# Patient Record
Sex: Female | Born: 1955 | Race: White | Hispanic: No | Marital: Married | Smoking: Former smoker
Health system: Southern US, Community
[De-identification: ages and names within clinical notes are randomized; demographics above are authoritative.]

## PROBLEM LIST (undated history)

## (undated) DIAGNOSIS — I10 Essential (primary) hypertension: Secondary | ICD-10-CM

## (undated) DIAGNOSIS — N61 Mastitis without abscess: Secondary | ICD-10-CM

## (undated) DIAGNOSIS — C50419 Malignant neoplasm of upper-outer quadrant of unspecified female breast: Secondary | ICD-10-CM

## (undated) DIAGNOSIS — J45909 Unspecified asthma, uncomplicated: Secondary | ICD-10-CM

## (undated) DIAGNOSIS — I8290 Acute embolism and thrombosis of unspecified vein: Secondary | ICD-10-CM

## (undated) DIAGNOSIS — C4490 Unspecified malignant neoplasm of skin, unspecified: Secondary | ICD-10-CM

## (undated) HISTORY — DX: Mastitis without abscess: N61.0

## (undated) HISTORY — DX: Essential (primary) hypertension: I10

## (undated) HISTORY — DX: Acute embolism and thrombosis of unspecified vein: I82.90

## (undated) HISTORY — DX: Unspecified asthma, uncomplicated: J45.909

## (undated) HISTORY — DX: Malignant neoplasm of upper-outer quadrant of unspecified female breast: C50.419

## (undated) HISTORY — DX: Unspecified malignant neoplasm of skin, unspecified: C44.90

---
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
## 1976-11-15 DIAGNOSIS — I8290 Acute embolism and thrombosis of unspecified vein: Secondary | ICD-10-CM

## 1976-11-15 HISTORY — PX: BREAST SURGERY: SHX581

## 1976-11-15 HISTORY — DX: Acute embolism and thrombosis of unspecified vein: I82.90

## 2006-04-04 ENCOUNTER — Ambulatory Visit: Payer: Self-pay

## 2007-05-11 ENCOUNTER — Ambulatory Visit: Payer: Self-pay | Admitting: Unknown Physician Specialty

## 2008-11-15 DIAGNOSIS — I10 Essential (primary) hypertension: Secondary | ICD-10-CM

## 2008-11-15 DIAGNOSIS — J45909 Unspecified asthma, uncomplicated: Secondary | ICD-10-CM

## 2008-11-15 HISTORY — DX: Unspecified asthma, uncomplicated: J45.909

## 2008-11-15 HISTORY — DX: Essential (primary) hypertension: I10

## 2009-01-13 ENCOUNTER — Ambulatory Visit: Payer: Self-pay | Admitting: Family Medicine

## 2010-04-14 ENCOUNTER — Encounter: Admission: RE | Admit: 2010-04-14 | Discharge: 2010-04-14 | Payer: Self-pay | Admitting: Radiology

## 2010-12-06 ENCOUNTER — Encounter: Payer: Self-pay | Admitting: Radiology

## 2011-11-16 DIAGNOSIS — N61 Mastitis without abscess: Secondary | ICD-10-CM

## 2011-11-16 HISTORY — PX: BREAST BIOPSY: SHX20

## 2011-11-16 HISTORY — DX: Mastitis without abscess: N61.0

## 2011-11-16 HISTORY — PX: BREAST LUMPECTOMY: SHX2

## 2011-11-16 HISTORY — PX: BREAST RECONSTRUCTION: SHX9

## 2012-03-27 ENCOUNTER — Other Ambulatory Visit: Payer: Self-pay | Admitting: General Surgery

## 2012-03-27 DIAGNOSIS — T8543XA Leakage of breast prosthesis and implant, initial encounter: Secondary | ICD-10-CM

## 2012-03-27 DIAGNOSIS — N61 Mastitis without abscess: Secondary | ICD-10-CM

## 2012-03-27 DIAGNOSIS — Z803 Family history of malignant neoplasm of breast: Secondary | ICD-10-CM

## 2012-04-11 ENCOUNTER — Other Ambulatory Visit: Payer: Self-pay

## 2012-05-06 DIAGNOSIS — I10 Essential (primary) hypertension: Secondary | ICD-10-CM

## 2012-06-05 ENCOUNTER — Ambulatory Visit: Payer: Self-pay | Admitting: Anesthesiology

## 2012-06-05 LAB — CBC WITH DIFFERENTIAL/PLATELET
Basophil %: 0.6 %
Eosinophil %: 5.1 %
HGB: 13.3 g/dL (ref 12.0–16.0)
Lymphocyte #: 3.4 10*3/uL (ref 1.0–3.6)
Monocyte #: 0.7 x10 3/mm (ref 0.2–0.9)
Platelet: 203 10*3/uL (ref 150–440)
RBC: 4.59 10*6/uL (ref 3.80–5.20)

## 2012-06-08 ENCOUNTER — Ambulatory Visit: Payer: Self-pay | Admitting: General Surgery

## 2012-06-08 DIAGNOSIS — C50419 Malignant neoplasm of upper-outer quadrant of unspecified female breast: Secondary | ICD-10-CM

## 2012-06-08 HISTORY — DX: Malignant neoplasm of upper-outer quadrant of unspecified female breast: C50.419

## 2012-06-08 LAB — URINALYSIS, COMPLETE
Bacteria: NONE SEEN
Bilirubin,UR: NEGATIVE
Blood: NEGATIVE
Glucose,UR: NEGATIVE mg/dL (ref 0–75)
Leukocyte Esterase: NEGATIVE
Nitrite: NEGATIVE
Ph: 6 (ref 4.5–8.0)
RBC,UR: <1 /HPF (ref 0–5)
WBC UR: 1 /HPF (ref 0–5)

## 2012-06-08 LAB — BASIC METABOLIC PANEL
BUN: 20 mg/dL — ABNORMAL HIGH (ref 7–18)
Creatinine: 0.72 mg/dL (ref 0.60–1.30)
Glucose: 105 mg/dL — ABNORMAL HIGH (ref 65–99)
Osmolality: 284 (ref 275–301)
Potassium: 4 mmol/L (ref 3.5–5.1)
Sodium: 141 mmol/L (ref 136–145)

## 2012-07-19 ENCOUNTER — Ambulatory Visit: Payer: Self-pay | Admitting: Radiation Oncology

## 2012-08-15 ENCOUNTER — Ambulatory Visit: Payer: Self-pay | Admitting: Radiation Oncology

## 2012-08-21 LAB — CBC CANCER CENTER
Basophil %: 1.1 %
Eosinophil %: 8.8 %
Lymphocyte #: 2 x10 3/mm (ref 1.0–3.6)
MCHC: 32.6 g/dL (ref 32.0–36.0)
Monocyte #: 0.7 x10 3/mm (ref 0.2–0.9)
Platelet: 205 x10 3/mm (ref 150–440)

## 2012-08-28 LAB — CBC CANCER CENTER
Eosinophil #: 0.5 x10 3/mm (ref 0.0–0.7)
Eosinophil %: 10.1 %
Lymphocyte #: 1.6 x10 3/mm (ref 1.0–3.6)
MCH: 29.2 pg (ref 26.0–34.0)
MCV: 89 fL (ref 80–100)
Monocyte #: 0.7 x10 3/mm (ref 0.2–0.9)
Platelet: 178 x10 3/mm (ref 150–440)
RBC: 4.46 10*6/uL (ref 3.80–5.20)

## 2012-09-04 LAB — CBC CANCER CENTER
Basophil #: 0 x10 3/mm (ref 0.0–0.1)
Basophil %: 1.1 %
Eosinophil #: 0.4 x10 3/mm (ref 0.0–0.7)
Lymphocyte %: 20 %
MCHC: 32.4 g/dL (ref 32.0–36.0)
Monocyte #: 0.6 x10 3/mm (ref 0.2–0.9)
Platelet: 176 x10 3/mm (ref 150–440)
RDW: 12.7 % (ref 11.5–14.5)
WBC: 4.5 x10 3/mm (ref 3.6–11.0)

## 2012-09-11 LAB — CBC CANCER CENTER
Basophil #: 0.1 x10 3/mm (ref 0.0–0.1)
Lymphocyte #: 0.8 x10 3/mm — ABNORMAL LOW (ref 1.0–3.6)
MCH: 28.9 pg (ref 26.0–34.0)
MCV: 89 fL (ref 80–100)
Monocyte #: 0.6 x10 3/mm (ref 0.2–0.9)
Platelet: 164 x10 3/mm (ref 150–440)
RDW: 12.9 % (ref 11.5–14.5)
WBC: 4.4 x10 3/mm (ref 3.6–11.0)

## 2012-09-13 IMAGING — US US OUTSIDE FILMS BREAST
1 series · 14 of 21 positions shown · non-contrast
Comparison: none

[Series 1: us outside films breast · 14 of 21 slices shown]
[im 1/21]
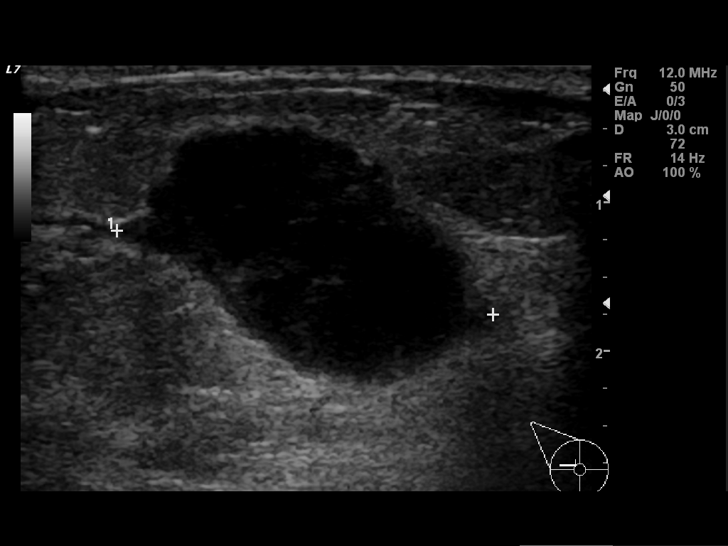
[im 3/21]
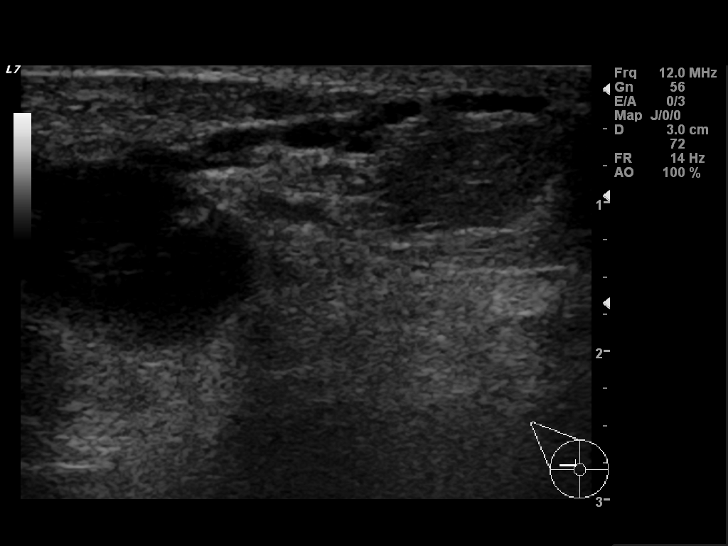
[im 4/21]
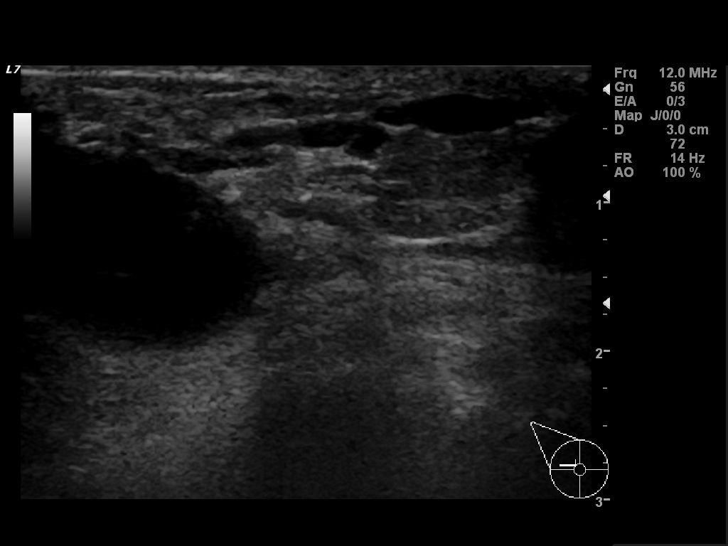
[im 6/21]
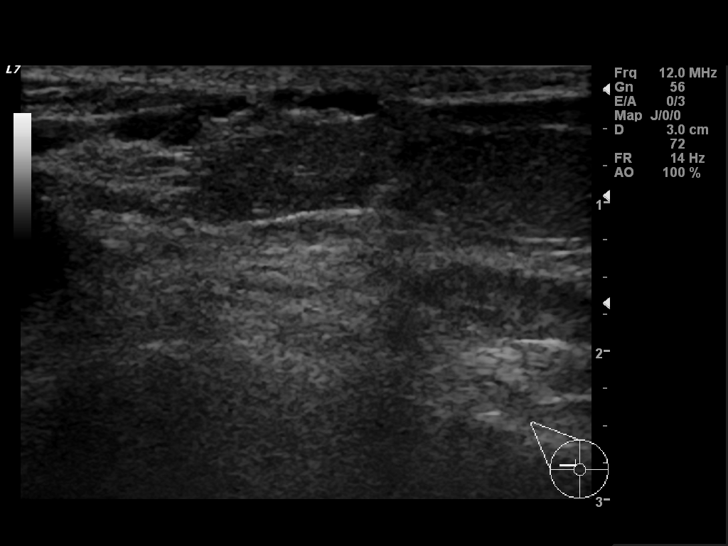
[im 7/21]
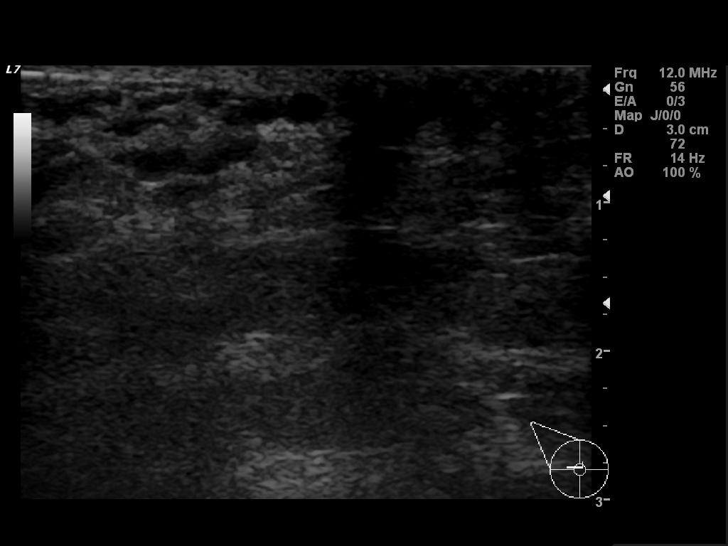
[im 9/21]
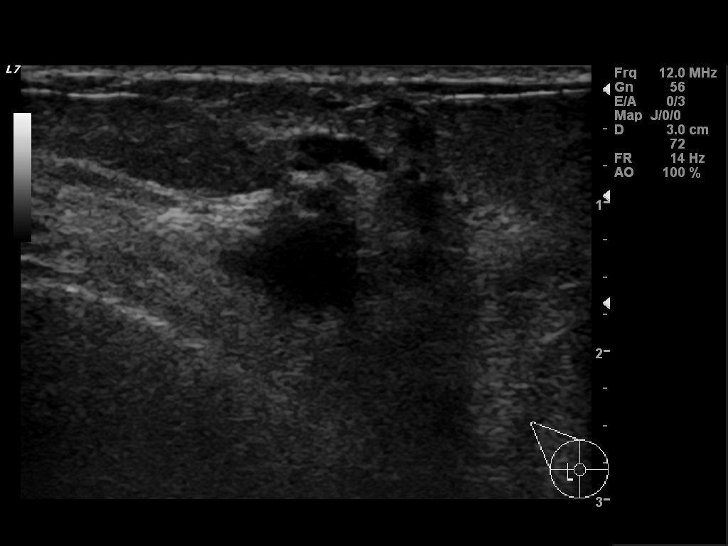
[im 10/21]
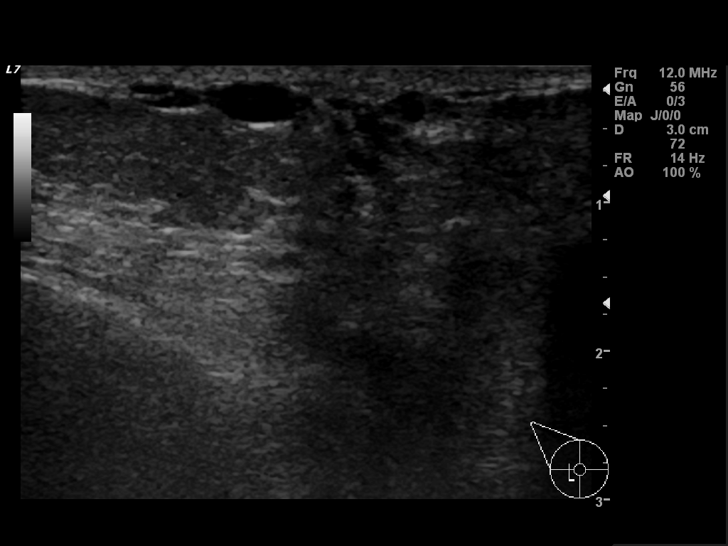
[im 12/21]
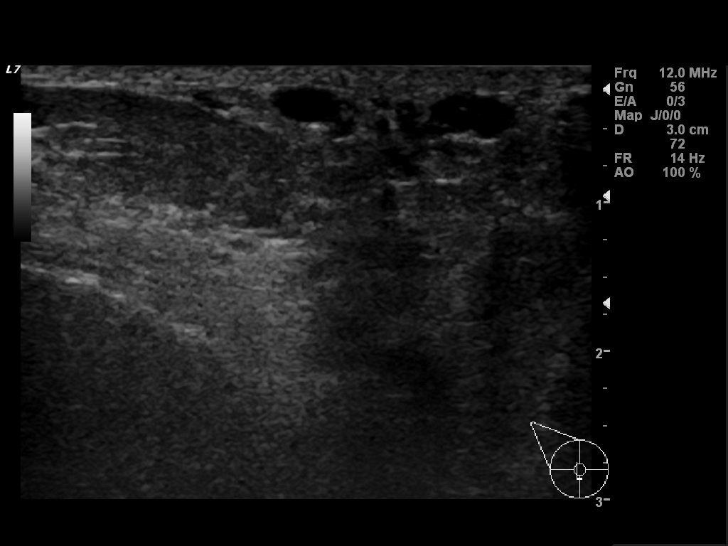
[im 13/21]
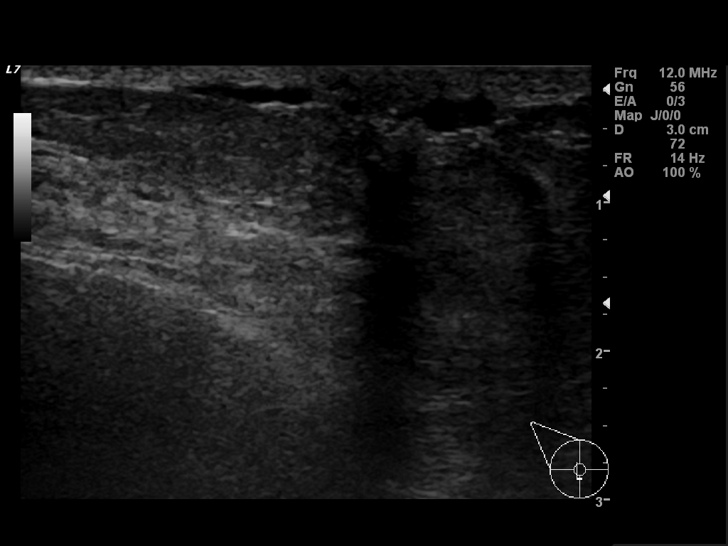
[im 15/21]
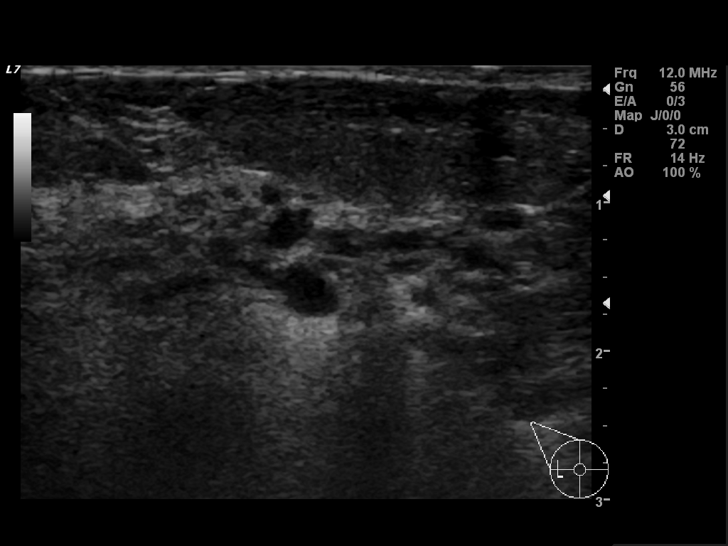
[im 16/21]
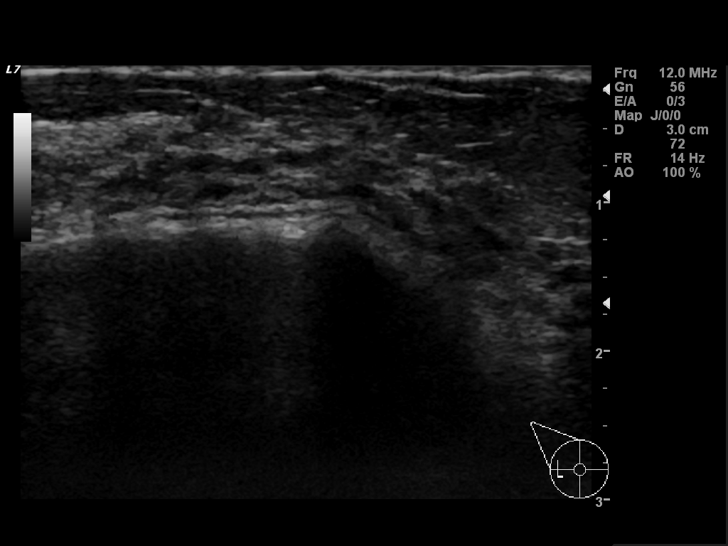
[im 18/21]
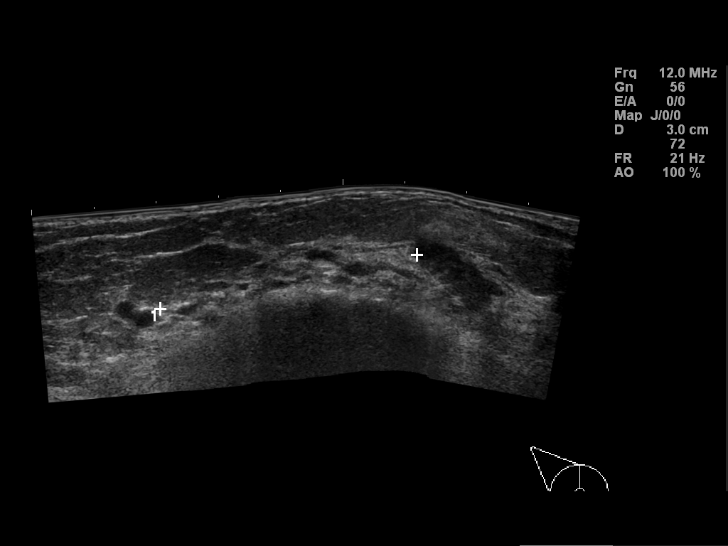
[im 19/21]
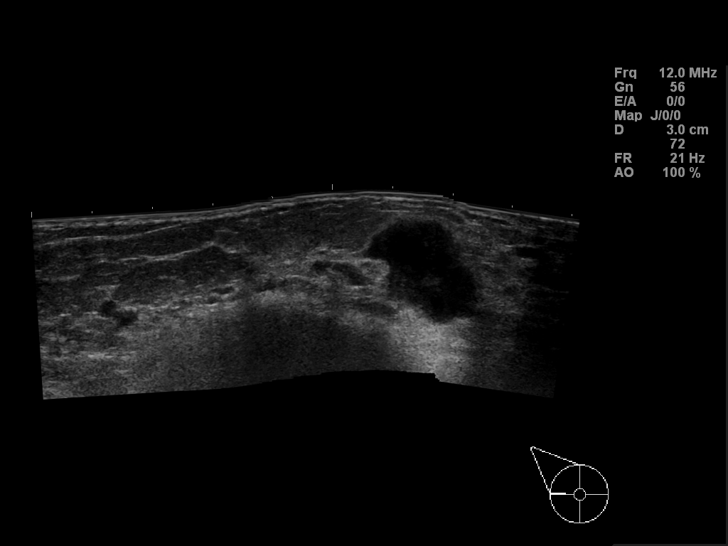
[im 21/21]
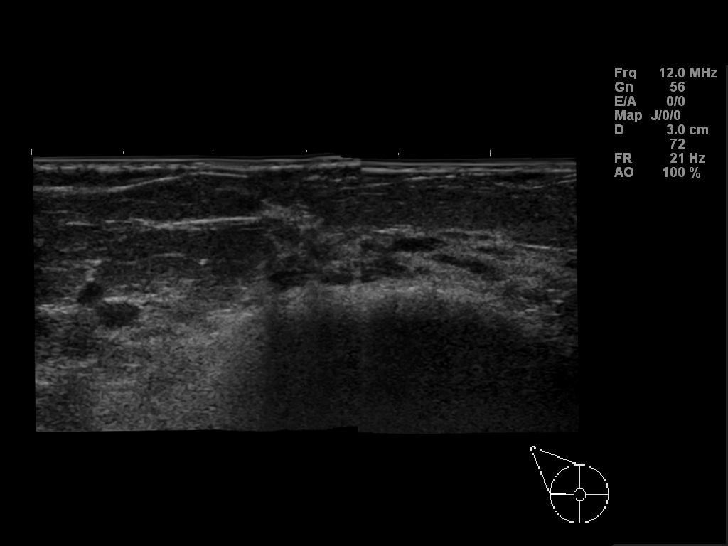

[14 of 21 positions shown; findings below may reference images not displayed]

IMAGES IMPORTED FROM THE [employer] WORKFLOW SYSTEM
NO DICTATION FOR STUDY

## 2012-09-15 ENCOUNTER — Ambulatory Visit: Payer: Self-pay | Admitting: Radiation Oncology

## 2012-09-19 LAB — CBC CANCER CENTER
Basophil #: 0.1 x10 3/mm (ref 0.0–0.1)
Basophil %: 1.3 %
Eosinophil %: 10.3 %
HCT: 41.5 % (ref 35.0–47.0)
HGB: 13.5 g/dL (ref 12.0–16.0)
Lymphocyte #: 0.8 x10 3/mm — ABNORMAL LOW (ref 1.0–3.6)
Lymphocyte %: 18.7 %
MCV: 90 fL (ref 80–100)
Monocyte %: 15.5 %
Neutrophil #: 2.4 x10 3/mm (ref 1.4–6.5)
WBC: 4.5 x10 3/mm (ref 3.6–11.0)

## 2012-09-25 LAB — CBC CANCER CENTER
Basophil #: 0 x10 3/mm (ref 0.0–0.1)
Basophil %: 0.9 %
Eosinophil #: 0.4 x10 3/mm (ref 0.0–0.7)
Eosinophil %: 8.5 %
HGB: 13.8 g/dL (ref 12.0–16.0)
Lymphocyte #: 0.7 x10 3/mm — ABNORMAL LOW (ref 1.0–3.6)
Lymphocyte %: 14.7 %
Monocyte #: 0.7 x10 3/mm (ref 0.2–0.9)
Monocyte %: 15.1 %
Neutrophil %: 60.8 %
RBC: 4.77 10*6/uL (ref 3.80–5.20)
WBC: 4.9 x10 3/mm (ref 3.6–11.0)

## 2012-10-15 ENCOUNTER — Ambulatory Visit: Payer: Self-pay | Admitting: Radiation Oncology

## 2012-11-15 ENCOUNTER — Ambulatory Visit: Payer: Self-pay | Admitting: Radiation Oncology

## 2012-11-15 HISTORY — PX: BREAST RECONSTRUCTION: SHX9

## 2012-11-29 ENCOUNTER — Ambulatory Visit: Payer: Self-pay | Admitting: General Surgery

## 2012-12-11 ENCOUNTER — Ambulatory Visit: Payer: Self-pay | Admitting: General Surgery

## 2012-12-19 ENCOUNTER — Ambulatory Visit: Payer: Self-pay

## 2012-12-23 ENCOUNTER — Encounter: Payer: Self-pay | Admitting: General Surgery

## 2012-12-23 DIAGNOSIS — D0590 Unspecified type of carcinoma in situ of unspecified breast: Secondary | ICD-10-CM

## 2012-12-23 DIAGNOSIS — D0511 Intraductal carcinoma in situ of right breast: Secondary | ICD-10-CM | POA: Insufficient documentation

## 2012-12-23 DIAGNOSIS — J45909 Unspecified asthma, uncomplicated: Secondary | ICD-10-CM

## 2013-03-30 ENCOUNTER — Ambulatory Visit: Payer: Self-pay | Admitting: Unknown Physician Specialty

## 2013-04-15 ENCOUNTER — Ambulatory Visit: Payer: Self-pay | Admitting: Radiation Oncology

## 2013-05-29 ENCOUNTER — Ambulatory Visit: Payer: Self-pay | Admitting: General Surgery

## 2013-05-30 ENCOUNTER — Encounter: Payer: Self-pay | Admitting: General Surgery

## 2013-05-30 ENCOUNTER — Ambulatory Visit: Payer: Self-pay | Admitting: General Surgery

## 2013-06-20 ENCOUNTER — Encounter: Payer: Self-pay | Admitting: General Surgery

## 2013-06-20 ENCOUNTER — Other Ambulatory Visit: Payer: Self-pay

## 2013-06-20 ENCOUNTER — Ambulatory Visit (INDEPENDENT_AMBULATORY_CARE_PROVIDER_SITE_OTHER): Payer: BC Managed Care – PPO | Admitting: General Surgery

## 2013-06-20 VITALS — BP 140/78 | HR 74 | Resp 12 | Ht 66.0 in | Wt 171.0 lb

## 2013-06-20 DIAGNOSIS — Z853 Personal history of malignant neoplasm of breast: Secondary | ICD-10-CM

## 2013-06-20 NOTE — Progress Notes (Signed)
Patient ID: Terri Morrison, female   DOB: 06-Dec-1955, 57 y.o.   MRN: [id number]  Chief Complaint  Patient presents with  . Other    mammogram    HPI Terri Morrison is a 57 y.o. female who presents for a breast evaluation. The most recent mammogram was done on 04/29/13 cat 2. Patient does perform regular self breast checks and gets regular mammograms done.  The patient was treated for a right breast DCIS with wide excision followed by whole breast radiation and subsequent mammoplasty in summer 2013. A sentinel node biopsy was completed at that time as well. The patient has no complaints at this time.  Weight is up 4 pounds from her January 2014 exam.  HPI  Past Medical History  Diagnosis Date  . Asthma 2010  . Hypertension 2010  . Cancer 2013    Right breast DCIS  . Blood clot in vein 1978  . Acute mastitis of right breast 2013    Past Surgical History  Procedure Laterality Date  . Tonsillectomy  1973  . Breast surgery Bilateral 1978    Breast augmentation  . Breast biopsy Right 2013    DCIS  . Breast lumpectomy Right 2013  . Breast reconstruction Right 2013  . Breast reconstruction Left 2014    Family History  Problem Relation Age of Onset  . Cancer Sister 34    Breast    Social History History  Substance Use Topics  . Smoking status: Former Smoker -- 1.00 packs/day for 16 years    Types: Cigarettes  . Smokeless tobacco: Never Used  . Alcohol Use: 0.5 oz/week    1 drink(s) per week     Comment: Occasional drinker    Allergies  Allergen Reactions  . Cefdinir Hives    Current Outpatient Prescriptions  Medication Sig Dispense Refill  . diphenhydrAMINE (BENADRYL) 25 MG tablet Take 25 mg by mouth every 6 (six) hours as needed for itching.      . fexofenadine (ALLEGRA) 180 MG tablet Take 180 mg by mouth 2 (two) times daily.      . hydroxychloroquine (PLAQUENIL) 200 MG tablet Take 200 mg by mouth 2 (two) times daily.      Marland Kitchen losartan (COZAAR) 100 MG tablet  Take 100 mg by mouth daily.      . montelukast (SINGULAIR) 10 MG tablet Take 10 mg by mouth at bedtime.      . ranitidine (ZANTAC) 75 MG tablet Take 75 mg by mouth daily.      . simvastatin (ZOCOR) 20 MG tablet Take 20 mg by mouth every evening.      . naproxen sodium (ANAPROX) 220 MG tablet Take 220 mg by mouth 2 (two) times daily with a meal.       No current facility-administered medications for this visit.    Review of Systems Review of Systems  Constitutional: Negative.   Respiratory: Negative.   Cardiovascular: Negative.     Blood pressure 140/78, pulse 74, resp. rate 12, height 5\' 6"  (1.676 m), weight 171 lb (77.565 kg), last menstrual period 12/23/2002.  Physical Exam Physical Exam  Constitutional: She is oriented to person, place, and time. She appears well-developed and well-nourished.  Neck: Neck supple.  Cardiovascular: Normal rate, regular rhythm and normal heart sounds.   Pulmonary/Chest: Breath sounds normal. Right breast exhibits no inverted nipple, no mass, no nipple discharge, no skin change and no tenderness. Left breast exhibits no inverted nipple, no mass, no nipple discharge, no skin change  and no tenderness.  Left breast well healed incision up around the nipple and down to 6 o'clock.Tennder at the axillary tail of the breast. Right breast scar nicely healed.  Lymphadenopathy:    She has no cervical adenopathy.  Neurological: She is alert and oriented to person, place, and time.  Skin: Skin is warm and dry.  Mild thickening maxillary tail is noted bilaterally.  Data Reviewed May 29, 2013 bilateral mammograms were reviewed. No interval change. BI-RAD-2.  Ultrasound examination of the axillary tail bilaterally was completed due to focal thickening. This showed normal architecture. No cystic or solid lesions.  Assessment    Benign breast exam.  One year status post treatment right breast DCIS. ER less than 5%, PR negative.     Plan    Antiestrogen  therapy was not felt to be warranted based on the very low ER status.  Arrangements were made for diagnostic bilateral mammograms in one year. Considering the most recent mammograms, which were reviewed, a 6 month followup of the treated breast is felt to be of little benefit.        Terri Morrison 06/21/2013, 9:34 PM

## 2013-06-20 NOTE — Patient Instructions (Addendum)
Patient to return one year mammogram.

## 2013-06-21 ENCOUNTER — Encounter: Payer: Self-pay | Admitting: General Surgery

## 2013-11-15 HISTORY — PX: COLONOSCOPY: SHX174

## 2014-06-06 ENCOUNTER — Encounter: Payer: Self-pay | Admitting: General Surgery

## 2014-06-26 ENCOUNTER — Encounter: Payer: Self-pay | Admitting: General Surgery

## 2014-07-08 ENCOUNTER — Ambulatory Visit: Payer: BC Managed Care – PPO | Admitting: General Surgery

## 2014-07-08 ENCOUNTER — Encounter: Payer: Self-pay | Admitting: General Surgery

## 2014-07-08 ENCOUNTER — Ambulatory Visit (INDEPENDENT_AMBULATORY_CARE_PROVIDER_SITE_OTHER): Payer: BC Managed Care – PPO | Admitting: General Surgery

## 2014-07-08 VITALS — BP 134/76 | HR 74 | Resp 12 | Ht 66.0 in | Wt 158.0 lb

## 2014-07-08 DIAGNOSIS — Z853 Personal history of malignant neoplasm of breast: Secondary | ICD-10-CM

## 2014-07-08 MED ORDER — TAMOXIFEN CITRATE 20 MG PO TABS: 20.0000 mg | ORAL_TABLET | Freq: Every day | ORAL | Status: DC

## 2014-07-08 NOTE — Patient Instructions (Addendum)
.  The patient has been asked to return to the office in one year with a bilateral diagnostic mammogram. Discussed Tamoxifen. Patient to try Tamoxifen for one month and call the office and let us know how you are doing.

## 2014-07-08 NOTE — Progress Notes (Signed)
Patient ID: Terri Morrison, female   DOB: 05/31/1956, 58 y.o.   MRN: [id number]  Chief Complaint  Patient presents with  . Follow-up    mammogram    HPI Terri Morrison is a 58 y.o. female who presents for a breast evaluation. The most recent mammogram was done on 06/25/14 at [facility]  Patient does perform regular self breast checks and gets regular mammograms done.    HPI  Past Medical History  Diagnosis Date  . Asthma 2010  . Hypertension 2010  . Blood clot in vein 1978  . Acute mastitis of right breast 2013  . 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    Past Surgical History  Procedure Laterality Date  . Tonsillectomy  1973  . Breast surgery Bilateral 1978    Breast augmentation  . Breast biopsy Right 2013    DCIS, ER less than 5%, PR negative.  Multiple positive margins identified after mastopexy making reexcision unreliable. Decision to complete whole breast radiation rather than proceed to mastectomy. Increased risk for recurrent disease reviewed with patient and family..  . Breast lumpectomy Right 2013  . Breast reconstruction Right 2013  . Breast reconstruction Left 2014    Family History  Problem Relation Age of Onset  . Cancer Sister 85    Breast    Social History History  Substance Use Topics  . Smoking status: Former Smoker -- 1.00 packs/day for 16 years    Types: Cigarettes  . Smokeless tobacco: Never Used  . Alcohol Use: 0.5 oz/week    1 drink(s) per week     Comment: Occasional drinker    Allergies  Allergen Reactions  . Cefdinir Hives    Current Outpatient Prescriptions  Medication Sig Dispense Refill  . diphenhydrAMINE (BENADRYL) 25 MG tablet Take 25 mg by mouth every 6 (six) hours as needed for itching.      . fexofenadine (ALLEGRA) 180 MG tablet Take 180 mg by mouth 2 (two) times daily.      . hydroxychloroquine (PLAQUENIL) 200 MG tablet Take 200 mg by mouth 2  (two) times daily.      . hydrOXYzine (ATARAX/VISTARIL) 25 MG tablet Take 25 mg by mouth. 25 mg for 2d, 50 mg for 2d, 75 mg for 2 d, then 100 mg every night.      Marland Kitchen ibuprofen (ADVIL,MOTRIN) 600 MG tablet Take 600 mg by mouth every 8 (eight) hours as needed.       Marland Kitchen losartan (COZAAR) 100 MG tablet Take 100 mg by mouth daily.      . montelukast (SINGULAIR) 10 MG tablet Take 10 mg by mouth at bedtime.      . naproxen sodium (ANAPROX) 220 MG tablet Take 220 mg by mouth 2 (two) times daily with a meal.      . ranitidine (ZANTAC) 75 MG tablet Take 75 mg by mouth daily.      . simvastatin (ZOCOR) 20 MG tablet Take 20 mg by mouth every evening.      . tamoxifen (NOLVADEX) 20 MG tablet Take 1 tablet (20 mg total) by mouth daily.  30 tablet  12   No current facility-administered medications for this visit.    Review of Systems Review of Systems  Constitutional: Negative.   Respiratory: Negative.   Cardiovascular: Negative.     Blood pressure 134/76, pulse 74, resp. rate 12, height 5\' 6"  (1.676 m), weight 158 lb (  71.668 kg), last menstrual period 12/23/2002.  Physical Exam Physical Exam  Constitutional: She is oriented to person, place, and time. She appears well-developed and well-nourished.  Eyes: Conjunctivae are normal. No scleral icterus.  Neck: Neck supple.  Cardiovascular: Normal rate, regular rhythm and normal heart sounds.   Pulmonary/Chest: Effort normal and breath sounds normal. Right breast exhibits no inverted nipple, no mass, no nipple discharge, no skin change and no tenderness. Left breast exhibits no inverted nipple, no mass, no nipple discharge, no skin change and no tenderness.  Right breast thickening along  Mastoplasty scar.   Good breast volume & symmetry.  Abdominal: Soft. Bowel sounds are normal. There is no tenderness.  Lymphadenopathy:    She has no cervical adenopathy.    She has no axillary adenopathy.  Neurological: She is alert and oriented to person, place, and  time.  Skin: Skin is warm and dry.    Data Reviewed Bilateral mammograms dated June 25, 2014 showed postlumpectomy changes. BI-RAD-2.  Assessment    Benign breast exam.     Plan    Recent literature supports the use of anti-estrogen therapy even tumors with 05% ER positivity. The pros and cons of fashion therapy with tamoxifen for management of DCIS were reviewed. Risks include DVT, uterine cancer and vasomotor symptoms. The patient does have a history of DVT occurring after 1978 breast implant procedure. No other risk factors (no longer smoking).  At this time the patient's amenable to a trial of tamoxifen 20 mg daily. She'll report her tolerance in one month. If she is doing well she'll continue the medication. If she Tolerated poorly, will discuss changing to Evista.     PCP: Virgie Dad 07/10/2014, 8:28 PM

## 2014-07-09 ENCOUNTER — Encounter: Payer: Self-pay | Admitting: General Surgery

## 2014-07-09 DIAGNOSIS — Z853 Personal history of malignant neoplasm of breast: Secondary | ICD-10-CM | POA: Insufficient documentation

## 2014-07-10 ENCOUNTER — Encounter: Payer: Self-pay | Admitting: General Surgery

## 2014-09-16 ENCOUNTER — Encounter: Payer: Self-pay | Admitting: General Surgery

## 2014-11-15 DIAGNOSIS — C4490 Unspecified malignant neoplasm of skin, unspecified: Secondary | ICD-10-CM

## 2014-11-15 HISTORY — DX: Unspecified malignant neoplasm of skin, unspecified: C44.90

## 2014-12-12 ENCOUNTER — Telehealth: Payer: Self-pay

## 2014-12-12 NOTE — Telephone Encounter (Signed)
Patient calling with concerns about discomfort at her incision site of her right breast and nipple discomfort. She had surgery in July of 2013 and wanted to speak with someone about this.

## 2014-12-12 NOTE — Telephone Encounter (Signed)
I talked with the patient and she feels the area is "ok" but she just wanted to have it checked. Denies redness or swelling , states it is near the incision area and nipple right breast. Appointment made 12-26-14 (best time for pt). She will monitor the area use heat and ibuprofen and call for new changes.

## 2014-12-26 ENCOUNTER — Ambulatory Visit (INDEPENDENT_AMBULATORY_CARE_PROVIDER_SITE_OTHER): Payer: Medicare Other | Admitting: General Surgery

## 2014-12-26 ENCOUNTER — Encounter: Payer: Self-pay | Admitting: General Surgery

## 2014-12-26 VITALS — BP 148/78 | HR 76 | Resp 14 | Ht 66.0 in | Wt 174.0 lb

## 2014-12-26 DIAGNOSIS — N644 Mastodynia: Secondary | ICD-10-CM

## 2014-12-26 NOTE — Progress Notes (Signed)
Patient ID: Terri Morrison, female   DOB: [birth year], 59 y.o.   MRN: [id number]  Chief Complaint  Patient presents with  . Breast Problem    HPI Terri Morrison is a 59 y.o. female.  Here today for evaluation of soreness near the incision area and nipple right breast. She first noticed it about 3 weeks. The ibuprofen seems to be helping. She has not been taking her tamoxifen because of side effects but she cant remember when she stopped taking it or the side effect. Asthma has been an issue this week with an occasional productive cough.  HPI  Past Medical History  Diagnosis Date  . Asthma 2010  . Hypertension 2010  . Blood clot in vein 1978  . Acute mastitis of right breast 2013  . 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    Past Surgical History  Procedure Laterality Date  . Tonsillectomy  1973  . Breast surgery Bilateral 1978    Breast augmentation  . Breast biopsy Right 2013    DCIS, ER less than 5%, PR negative.  Multiple positive margins identified after mastopexy making reexcision unreliable. Decision to complete whole breast radiation rather than proceed to mastectomy. Increased risk for recurrent disease reviewed with patient and family..  . Breast lumpectomy Right 2013  . Breast reconstruction Right 2013  . Breast reconstruction Left 2014    Family History  Problem Relation Age of Onset  . Cancer Sister 77    Breast    Social History History  Substance Use Topics  . Smoking status: Former Smoker -- 1.00 packs/day for 16 years    Types: Cigarettes  . Smokeless tobacco: Never Used  . Alcohol Use: 0.5 oz/week    1 drink(s) per week     Comment: Occasional drinker    Allergies  Allergen Reactions  . Cefdinir Hives  . Clindamycin/Lincomycin Hives    Current Outpatient Prescriptions  Medication Sig Dispense Refill  . albuterol (PROVENTIL HFA;VENTOLIN HFA) 108 (90 BASE)  MCG/ACT inhaler Inhale into the lungs every 6 (six) hours as needed for wheezing or shortness of breath.    . diphenhydrAMINE (BENADRYL) 25 MG tablet Take 25 mg by mouth every 6 (six) hours as needed for itching.    . fexofenadine (ALLEGRA) 180 MG tablet Take 180 mg by mouth 2 (two) times daily.    . Fluticasone-Salmeterol (ADVAIR) 250-50 MCG/DOSE AEPB Inhale 1 puff into the lungs 2 (two) times daily.    . hydroxychloroquine (PLAQUENIL) 200 MG tablet Take 200 mg by mouth 2 (two) times daily.    Marland Kitchen ibuprofen (ADVIL,MOTRIN) 600 MG tablet Take 600 mg by mouth every 8 (eight) hours as needed.     Marland Kitchen losartan (COZAAR) 100 MG tablet Take 100 mg by mouth daily.    . Melatonin 10 MG CAPS Take by mouth as needed.    . montelukast (SINGULAIR) 10 MG tablet Take 10 mg by mouth at bedtime.    . ranitidine (ZANTAC) 75 MG tablet Take 75 mg by mouth daily.    . simvastatin (ZOCOR) 20 MG tablet Take 20 mg by mouth every evening.     No current facility-administered medications for this visit.    Review of Systems Review of Systems  Constitutional: Negative.   Respiratory: Positive for cough.   Cardiovascular: Negative.     Blood pressure 148/78, pulse 76, resp. rate 14, height 5\' 6"  (1.676 m), weight  174 lb (78.926 kg), last menstrual period 12/23/2002.  Physical Exam Physical Exam  Constitutional: She is oriented to person, place, and time. She appears well-developed and well-nourished.  Neck: Neck supple.  Cardiovascular: Normal rate, regular rhythm and normal heart sounds.   Pulmonary/Chest: Effort normal. She has wheezes (Inspiratory wheezes in the lower lobes bilaterally.). She has rhonchi in the left upper field. Right breast exhibits skin change and tenderness. Right breast exhibits no inverted nipple, no mass and no nipple discharge. Left breast exhibits no inverted nipple, no mass, no nipple discharge, no skin change and no tenderness.    Abnormal breath sounds.  Thickening along right breast  scar at 7 o'clock with telangectasia and tenderness along the scar.   Lymphadenopathy:    She has no cervical adenopathy.    She has no axillary adenopathy.  Neurological: She is alert and oriented to person, place, and time.  Skin: Skin is warm and dry.      Assessment    Mastalgia likely secondary to ongoing effects of previous radiation therapy.    Plan    In spite of her benign clinical exam we'll arrange for a right breast mammogram. We'll reassess her progress in one month.    The patient has been asked to have a right diagnostic mammogram. This has been arranged at [facility] for 01-10-15 at 9:30 am. Patient is aware of date, time, and instructions.   Continue heating pad and ibuprofen as needed for comfort.  Observation for one month provided mammogram is normal or possibly proceed with biopsy.   PCP:  Virgie Dad 12/27/2014, 8:42 PM

## 2014-12-26 NOTE — Patient Instructions (Addendum)
The patient has been asked to have a right diagnostic mammogram.

## 2014-12-27 DIAGNOSIS — N644 Mastodynia: Secondary | ICD-10-CM | POA: Insufficient documentation

## 2015-01-06 ENCOUNTER — Encounter: Payer: Self-pay | Admitting: General Surgery

## 2015-01-23 ENCOUNTER — Ambulatory Visit: Payer: Medicare Other | Admitting: General Surgery

## 2015-03-04 NOTE — Op Note (Signed)
PATIENT NAME:  Terri Morrison, Terri Morrison MR#:  [id number] DATE OF BIRTH:  Apr 25, 1956  DATE OF PROCEDURE:  06/08/2012  PREOPERATIVE DIAGNOSIS: DCIS right breast.   POSTOPERATIVE DIAGNOSIS: DCIS right breast.   OPERATIVE PROCEDURE: Wide local excision, sentinel node biopsy.   OPERATING SURGEON: Robert Bellow, MD   ASSISTANT: Nicholaus Bloom, MD    ANESTHESIA: General.  ESTIMATED BLOOD LOSS: Less than 25 mL.   CLINICAL NOTE: This 59 year old woman was identified with an area of DCIS involving the right breast as well as a ruptured prepectoral implant. She was felt to be a candidate for wide excision and implant removal. The possibility of mastectomy or immediate reconstruction and a new implant had been reviewed with the patient. Coordination had been completed between Nicholaus Bloom, MD from the plastic surgery service and myself.   PROCEDURE: The patient has a history of penicillin allergy and for that reason received vancomycin prior to the procedure. The skin markings were completed by Dr. Tula Nakayama prior to the induction of anesthesia. The lesion was at the 8 to 9 o'clock position of the right breast. Ultrasound was used to identify the most medial aspect of the previous biopsy site. Using ultrasound guidance to remove the old biopsy site as review of her previous MRI for the residual breast tissue on that side, this was completed through a radial incision between the edge of the areola and the edge of tissue to be removed for her mastopexy. Skin was incised sharply and the remaining dissection completed with electrocautery. A 12 cm long 3 cm wide tissue extending from the subcutaneous fat to the pseudocapsule of her previous breast implant was completed, orientated and specimen radiograph obtained. This confirmed the previous biopsy clip. The specimen was then reviewed with the pathologist and there was 2 cm of adipose tissue between the medial aspect of the biopsy cavity and the infraareolar/retroareolar tissue.  This precluded frozen section analysis but there appeared to be a wide margin of normal tissue. Permanent sections will be obtained.   A sentinel node biopsy had been planned in the event that a mastectomy was necessary to obtain clear margins. She had been injected with technetium sulfur colloid prior to the procedure. Counts of 25 to 30 were noted in the axilla. A small incision was made in this area along the skin crease. This was carried down through the skin and subcutaneous tissue with hemostasis achieved by electrocautery. A single hot node was identified. This was sent in formalin for routine histology. Scanning through the axilla showed no other palpable lesions or areas of increased uptake. The wound was closed in layers with 2-0 Vicryl deep and running 4-0 Vicryl subcuticular suture for the skin.   Dr. Tula Nakayama assumed command at this time for removal of the prepectoral implant and oncoplastic reconstruction of the breast. This procedure will be dictated separately.   The patient tolerated the procedure well and was eventually taken to the recovery room in good condition.   ____________________________ Robert Bellow, MD jwb:drc D: 06/08/2012 20:28:11 ET T: 06/09/2012 10:06:01 ET JOB#: [id number]  cc: Robert Bellow, MD, <Dictator> Irven Easterly. Kary Kos, MD Cleda Daub, MD R. Barnett Applebaum, MD Sierria Bruney Amedeo Kinsman MD ELECTRONICALLY SIGNED 06/09/2012 14:36

## 2015-03-04 NOTE — Consult Note (Signed)
Reason for Visit: This 59 year old Female patient presents to the clinic for initial evaluation of  Breast cancer .   Referred by Dr. Hervey Ard.  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 She has had bilateral breast implants in the past. Initial biopsy showed ductal carcinoma in situ. She underwent removal of the breast implantwhich had ruptured and underwent mammoplasty. She had undergone a right breast wide local excision and sentinel node biopsy on 06/08/12. At the time sentinel node was negative. 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 Multiple margins including anterior margin deep margin inferior margin and close to the lateral margin. Surgery was performed with the assistance of Dr.Coan. A detailed discussion with the patient regarding her treatment options were made. Should options including total mastectomy and reconstruction without radiation versus no further surgery and whole breast radiation. Patient is extremely concerned about saving her breast even in the risk of higher incidence of recurrence based on her multiple positive margins. She is seen today for consideration of adjuvant radiation treatment. And is doing well. She is also scheduled to have removal of her left breast implant and reconstruction to have both breasts are equal symmetry.  Past Hx:    GERD - Esophageal Reflux:    HTN:    Arthritis:    Right Breast DCIS:    Asthma:    wheezing:     Right Breast Biopsy:    Facial Surgery: 1998   Deviated Septum Surgery: 1998   Tonsillectomy: 1970   Breast Implants: 1976  Past, Family and Social History:   Past Medical History positive    Cardiovascular hyperlipidemia; hypertension    Respiratory asthma; Wheezing    Gastrointestinal GERD    Past Surgical History Breast implants. Deviated septum correction, facial surgery,    Past Medical History Comments Arthritis    Family History noncontributory    Social History noncontributory    Additional Past Medical and Surgical History Patient seen accompanied by nurse navigator.   Allergies:   Cephalosporins: Hives  Lisinopril: Do NOT Use  Home Meds:  Home Medications: Medication Instructions Status  Allegra 24 Hour Allergy oral tablet 1 tab(s) orally 2 times a day Active  losartan 100 mg oral tablet 1 tab(s) orally once a day (in the morning) Active  ranitidine 75 mg oral tablet 1 tab(s) orally 2 times a day Active  Singulair 10 mg oral tablet 1 tab(s) orally once a day (in the evening) Active  hydroxychloroquine 200 mg oral tablet 1 tab(s) orally 2 times a day Active  simvastatin 20 mg oral tablet 1 tab(s) orally once a day (at bedtime) Active  Oxyselect Fat Burner 1 tab(s)  once a day Active  Advair Diskus 250 mcg-50 mcg inhalation powder 1 puff(s) inhaled 2 times a day Active  albuterol-ipratropium 103 mcg-18 mcg/inh inhalation aerosol 2 puff(s) inhaled 4 times a day, As Needed Active  naproxen sodium 220 mg oral tablet 2 tab(s) orally once a  day only as needed.  Active  Benadryl 25 mg oral tablet 1 tab(s) orally once a day (at bedtime) only as needed.  Active  ibuprofen 200 mg oral tablet 3 tab(s) orally once a day only as needed.  Active   Review of Systems:   General negative    Performance Status (ECOG) 0    Skin negative    Breast see HPI    Ophthalmologic negative    ENMT negative    Respiratory and Thorax see HPI    Cardiovascular negative     Gastrointestinal negative    Genitourinary negative    Musculoskeletal negative    Neurological negative    Psychiatric negative    Hematology/Lymphatics negative    Endocrine negative    Allergic/Immunologic negative    Review of Systems   Patient denies any weight loss, fatigue, weakness, fever, chills or night sweats. Patient denies any loss of vision, blurred vision. Patient denies any ringing  of the ears or hearing loss. No irregular heartbeat. Patient denies heart murmur or history of fainting. Patient denies any chest pain or pain radiating to her upper extremities. Patient denies any shortness of breath, difficulty breathing at night, cough or hemoptysis. Patient denies any swelling in the lower legs. Patient denies any nausea vomiting, vomiting of blood, or coffee ground material in the vomitus. Patient denies any stomach pain. Patient states has had normal bowel movements no significant constipation or diarrhea. Patient denies any dysuria, hematuria or significant nocturia. Patient denies any problems walking, swelling in the joints or loss of balance. Patient denies any skin changes, loss of hair or loss of weight. Patient denies any excessive worrying or anxiety or significant depression. Patient denies any problems with insomnia. Patient denies excessive thirst, polyuria, polydipsia. Patient denies any swollen glands, patient denies easy bruising or easy bleeding. Patient denies any recent infections, allergies or URI. Patient "s visual fields have not changed significantly in recent time.  Nursing Notes:  Nursing Vital Signs and Chemo Nursing Nursing Notes: *CC Vital Signs Flowsheet:   04-Sep-13 08:51   Temp Temperature 98.6   Pulse Pulse 74   Respirations Respirations 20   SBP SBP 179   DBP DBP 93   Pain Scale (0-10)  0   Current Weight (kg) (kg) 77.6   Height (cm) centimeters 168.4   BSA (m2) 1.8   Physical Exam:  General/Skin/HEENT:   General normal     Skin normal    Eyes normal    ENMT normal    Head and Neck normal    Additional PE Well-developed well-nourished female in NAD. Breasts are asymmetric with left breast being larger and more pendulous than the right. She is status post mammoplasty of the right breast with reconstruction the nipple area looks complex. No dominant mass or nodularity is noted in either breast into position examined. No axillary or supraclavicular adenopathy is noted. Lungs are clear to A&P cardiac examination shows regular rate and rhythm.   Breasts/Resp/CV/GI/GU:   Respiratory and Thorax normal    Cardiovascular normal    Gastrointestinal normal    Genitourinary normal   MS/Neuro/Psych/Lymph:   Musculoskeletal normal    Neurological normal    Lymphatics normal   Assessment and Plan:  Impression:   stage 0 ductal carcinoma in situ of the right breast status post mammoplasty and removal of breast implant for ER/PR negative ductal carcinoma in situ in 59 year old female  Plan:    at upper long discussion with the patient today  certainly #1 choicein my opinion would be mastectomy based on her positive margins. Patient is totally averse to having breast removed at this point. I agree with Dr. Hervey Ard assessment on risks of recurrence in the ipsilateral breast based on her positive margins being in approximately 10-20% range of ductal carcinoma in situ. Certainly radiation therapy will prevent and decrease the risk of right breast recurrence. I would treat her entire right breast to 5000 cGy and boost the scar another 2000 cGy based on the positive margins. Risks and benefits of treatment including skin reaction, alteration of blood counts, fatigue and some fibrosis of the breast for all discussed in detail with the patient with nurse navigator present. Patient has decided to proceed with radiation therapy I have set her up for treatment planning in the next 2 weeks. We will also discuss the case with  medical oncology although I do not think tamoxifen therapy with her ER/PR status is indicated.  I would like to take this opportunity to thank you for allowing me to continue to participate in this patient's care.  CC Referral:   cc: Dr. Hervey Ard, Dr. [place] Pink   Electronic Signatures: Baruch Gouty, Roda Shutters (MD)  (Signed 04-Sep-13 11:51)  Authored: HPI, Diagnosis, Past Hx, PFSH, Allergies, Home Meds, ROS, Nursing Notes, Physical Exam, Encounter Assessment and Plan, CC Referring Physician   Last Updated: 04-Sep-13 11:51 by Armstead Peaks (MD)

## 2015-03-04 NOTE — Op Note (Signed)
PATIENT NAME:  Terri Morrison, Terri Morrison MR#:  [id number] DATE OF BIRTH:  [birth year]  DATE OF PROCEDURE:  06/08/2012  PREOPERATIVE DIAGNOSES: 1. Right breast DCIS.  2. Right breast, grade 3 capsular contracture and ruptured silicone breast implant (greater than 61 years old).  3. Grade IV ptosis.  POSTOPERATIVE DIAGNOSES:  1. Right breast DCIS.  2. Status post lumpectomy defect.  3. Grade IV ptosis.  PROCEDURES:    1. Assist with right breast lumpectomy (assisted Dr. Bary Castilla).  2. Removal of ruptured silicone breast implant.  3. Removal of calcified implant capsule.  4. Breast reconstruction/mastopexy with rearrangement of local tissues for closure of significant lumpectomy defect.   SURGEON: Nicholaus Bloom, MD   ANESTHESIA: General endotracheal.   ESTIMATED BLOOD LOSS: 25 mL.  FINDIONGS: Severe calcifications around ruptured (564PP Silicone implant - Measured by displacement) DRAINS: 15 [place] Blake drain.  COMPLICATIONS: None.  DISPOSITION: Extubated stable to recovery.   STATEMENT OF NECESSITY: Terri Morrison is a 59 year old female with a history of subglandular breast implants that she has known for 20 years have been ruptured with now grade 3 capsular contracture. She presents with a new diagnosis of DCIS. We have discussed the various options with regard to breast conservation therapy versus implant-based reconstruction. The patient would like to maintain reconstruction with as much of her own tissue as possible but is concerned about her size. She also prefers lumpectomy and radiation therapy to total breast reconstruction. She would like to limit the number of surgeries. Given the fact that the patient has grade IV ptosis in addition to encapsulated implants and is going to undergo radiation therapy, we discussed the potential benefits with regard to tissue rearrangement and mastopexy prior to radiation therapy. She understands the risks, benefits, and alternatives.  No guarantees were given with  regard to final size or shape or the effects of radiotherapy.  Complications discussed included but were not limited to subsequent contour irregularity, scar contracture, visible scars, atrophy of the breast, fat necrosis, nipple loss, asymmetry, and need to possibly revise the complete reconstruction with an implant based reconstruction. All of these were discussed with her and no guarantees were given with regard to final cup size. The patient requested the procedure as described.   STATEMENT OF PROCEDURE: The patient was brought to the Operating Room awake, alert and comfortable and placed on the OR table in the supine position with both arms outstretched. Bony prominences were padded. SCDs were on and functioning. Endotracheal anesthesia was introduced without apparent complications. At this point attention was directed towards the right breast. Preoperative markings were made for a vertical type mastopexy. A circumareolar incision was made with a 42 mm cookie cutter and then a radial incision was made over the area of the expected biopsy. This location was determined by Dr. Bary Castilla using ultrasound. At this point, I assisted Dr. Bary Castilla in the lumpectomy portion of the procedure and this will be dictated as a separate note. At the completion of the lumpectomy, there was a very significant tissue deficit. The tissue cavity extended from just behind the nipple to approximately 8 cm laterally. It extended down to a depth of the encapsulated implant. This lumpectomy specimen was marked by Dr. Bary Castilla and removed. Hemostasis was confirmed. At this point, he performed an axillary lymph node biopsy during which point I performed the complete capsulectomy and implant removal. Under direct visualization and using electrocautery and a lighted retractor, the implant and capsule were removed en bloc. No bleeding was encountered and the  implant was not violated nor was the capsule ruptured. The incision was extended just  inferior to the areola and was able to be expressed from the cavity. The cavity was then copiously irrigated and inspected. The resultant tissue defect and cavity was quite large as would be expected with this kind of capsular contracture. The overlying tissue was felt to be adequate to achieve the patient's goals. The majority of the tissue deficit was in the nine o'clock position on breast radially. As such, it was felt that a vertical type mastopexy (SPAIR technique) would be ideal. A small amount of dissection was performed at the five o'clock position on the areola and the pedicle designed was a superior medial pedicle. The nipple was then able to easily mobilize into the de-epithelialized mosque pattern superiorly. This was tailor tacked and then the six o'clock position was closed below the areola. Using Allis clamps, the tissue on either side of the six o'clock position was elevated and then tailor tacking was used to close the lower pole of the breast and narrow this area. This was marked in a V-type fashion and de-epithelialized. Next, the tissue in the upper pole was mobilized. It had already been previously mobilized off of the pectoralis because of the previous subglandular implant. The lower edge was then secured with several interrupted 3-0 PDS sutures to the upper most portion of the pectoralis giving upper pole fullness to the breast. Next, the V-type de-epithelialized tissue was incised on the medial portion allowing the inferior medial tissue basically at 6:00 to rotate up and into the resulting tissue defect. Next, the lower pole was closed narrowing the base diameter and completing the lift. The lower pole tissue was secured in place using 3-0 PDS as well and a 15 [place] Blake drain was brought out through a stab incision laterally just prior to closure. Dermis was closed with 3-0 Vicryl. The nipple was inset with a wagon wheel CV3 Gore-Tex suture. The final closure was a 4-0 Monocryl. The  wounds were dressed with Dermabond and Steri-Strips. Needle, sponge, and lap counts were correct. She tolerated the procedure well. She was placed in a recumbent position several times to examine her for size, symmetry and shape. Nipple was pink and viable at the completion of the procedure. There was little to no bleeding through the procedure. She was extubated and transferred to recovery in good condition.   ____________________________ Cleda Daub, MD bsc:ap D: 06/08/2012 18:55:03 ET T: 06/09/2012 09:58:04 ET JOB#: [id number]  cc: Cleda Daub, MD, <Dictator> Cleda Daub MD ELECTRONICALLY SIGNED 06/10/2012 14:14

## 2015-05-12 ENCOUNTER — Other Ambulatory Visit: Payer: Self-pay

## 2015-05-12 DIAGNOSIS — D0511 Intraductal carcinoma in situ of right breast: Secondary | ICD-10-CM

## 2015-07-07 ENCOUNTER — Ambulatory Visit: Payer: Medicare Other | Admitting: General Surgery

## 2015-07-09 ENCOUNTER — Ambulatory Visit (INDEPENDENT_AMBULATORY_CARE_PROVIDER_SITE_OTHER): Payer: 59 | Admitting: General Surgery

## 2015-07-09 ENCOUNTER — Other Ambulatory Visit: Payer: 59

## 2015-07-09 ENCOUNTER — Other Ambulatory Visit: Payer: Self-pay | Admitting: General Surgery

## 2015-07-09 ENCOUNTER — Encounter: Payer: Self-pay | Admitting: General Surgery

## 2015-07-09 VITALS — BP 138/72 | HR 80 | Resp 12 | Ht 66.5 in | Wt 174.0 lb

## 2015-07-09 DIAGNOSIS — Z853 Personal history of malignant neoplasm of breast: Secondary | ICD-10-CM

## 2015-07-09 DIAGNOSIS — N6459 Other signs and symptoms in breast: Secondary | ICD-10-CM

## 2015-07-09 NOTE — Progress Notes (Signed)
Patient ID: Terri Morrison, female   DOB: 1956/07/24, 59 y.o.   MRN: [id number]  Chief Complaint  Patient presents with  . Follow-up    HPI Terri Morrison is a 59 y.o. female.  who presents for breast cancer follow up and breast evaluation. The most recent mammogram and ultrasound was done on 06-27-15.  Patient does perform regular self breast checks and gets regular mammograms done. No new breast complaints, she states she is worried since they did extra pictures ans an ultrasound. Her breast tenderness is unchanged, upper breast and nipple area is worse. She is followed by [facility] allergy/asthma physician and have not been doing her Advair as prescribed. She has a history of both implants rupturing in the past.  HPI  Past Medical History  Diagnosis Date  . Asthma 2010  . Hypertension 2010  . Blood clot in vein 1978  . Acute mastitis of right breast 2013  . 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 The benefits of antiestrogen therapy were felt to be too low to warrant administration based on low ER value.  . Skin cancer 2016    chest, forehead    Past Surgical History  Procedure Laterality Date  . Tonsillectomy  1973  . Breast surgery Bilateral 1978    Breast augmentation  . Breast biopsy Right 2013    DCIS, ER less than 5%, PR negative.  Multiple positive margins identified after mastopexy making reexcision unreliable. Decision to complete whole breast radiation rather than proceed to mastectomy. Increased risk for recurrent disease reviewed with patient and family..  . Breast lumpectomy Right 2013  . Breast reconstruction Right 2013  . Breast reconstruction Left 2014    Family History  Problem Relation Age of Onset  . Cancer Sister 19    Breast    Social History Social History  Substance Use Topics  . Smoking status: Former Smoker -- 1.00 packs/day for 16 years    Types: Cigarettes  .  Smokeless tobacco: Never Used  . Alcohol Use: 0.5 oz/week    1 drink(s) per week     Comment: Occasional drinker    Allergies  Allergen Reactions  . Cefdinir Hives  . Clindamycin/Lincomycin Hives    Current Outpatient Prescriptions  Medication Sig Dispense Refill  . albuterol (PROVENTIL HFA;VENTOLIN HFA) 108 (90 BASE) MCG/ACT inhaler Inhale into the lungs every 6 (six) hours as needed for wheezing or shortness of breath.    . fexofenadine (ALLEGRA) 180 MG tablet Take 180 mg by mouth 2 (two) times daily.    . Fluticasone-Salmeterol (ADVAIR) 250-50 MCG/DOSE AEPB Inhale 1 puff into the lungs 2 (two) times daily.    . hydroxychloroquine (PLAQUENIL) 200 MG tablet Take 200 mg by mouth 2 (two) times daily.    Marland Kitchen ibuprofen (ADVIL,MOTRIN) 600 MG tablet Take 600 mg by mouth every 8 (eight) hours as needed.     Marland Kitchen losartan (COZAAR) 100 MG tablet Take 100 mg by mouth daily.    . Melatonin 10 MG CAPS Take by mouth as needed.    . montelukast (SINGULAIR) 10 MG tablet Take 10 mg by mouth at bedtime.    . ranitidine (ZANTAC) 75 MG tablet Take 75 mg by mouth daily.    . simvastatin (ZOCOR) 20 MG tablet Take 20 mg by mouth every evening.     No current facility-administered medications for this visit.    Review of Systems Review  of Systems  Constitutional: Negative.   Respiratory: Positive for shortness of breath.   Cardiovascular: Negative.     Blood pressure 138/72, pulse 80, resp. rate 12, height 5' 6.5" (1.689 m), weight 174 lb (78.926 kg), last menstrual period 12/23/2002.  Physical Exam Physical Exam  Constitutional: She is oriented to person, place, and time. She appears well-developed and well-nourished.  HENT:  Mouth/Throat: Oropharynx is clear and moist.  Eyes: Conjunctivae are normal. No scleral icterus.  Neck: Neck supple.  Cardiovascular: Normal rate, regular rhythm and normal heart sounds.   Pulmonary/Chest: Effort normal. She has wheezes in the right upper field. She has  rhonchi. Right breast exhibits tenderness. Right breast exhibits no inverted nipple, no mass, no nipple discharge and no skin change. Left breast exhibits no inverted nipple, no mass, no nipple discharge, no skin change and no tenderness.    Post reconstruction bilaterally with telangectasia on right breast. Tender 10 o'clock 8 CFN right breast with focal thickening  Lymphadenopathy:    She has no cervical adenopathy.    She has no axillary adenopathy.  Neurological: She is alert and oriented to person, place, and time.  Skin: Skin is warm and dry.  Psychiatric: Her behavior is normal.    Data Reviewed Bilateral breast diagnostic mammogram and right breast ultrasound dated 03/01/2015 was reviewed. There was density in the right breast and ultrasound suggested possible silicon deposition. Otherwise no interval change. By red-3.  Ultrasound examination of the area of focal thickening in the right breast of the 10:00 position, 6 m from the nipple showed a 0.4 x 0.55 x 0.67 hypoechoic area with internal echoes and faint posterior acoustic shadowing. This was to represent fat necrosis. FNA sampling was completed. 22-gauge needle, 1 mL 1% plain Xylocaine. Multiple passes and slides 4 prepared.  Pathology from the 06/08/2012 wide excision showed the anterior, deep and inferior margins were positive for DCIS.  Review of the 06/08/2012 operative notes from implant removal in breast reconstruction confirms extensive dissection for mastoplasty to remove a calcified capsule and implant with dissection extending in all 4 quadrants.  Assessment    Focal breast thickening, likely benign. FNA completed.  DCIS with positive margin status post whole breast radiation.  Modest mammographic change likely secondary to ongoing scarring status post mastoplasty in whole breast radiation.  Abnormal pulmonary exam.    Plan    If the FNA is benign, I think a six-month follow-up right breast mammogram is very  reasonable.  While the patient reports she is not short of breath, she is a significantly abnormal pulmonary exam, and she was encouraged to follow up with her allergist who is managing her inhalers for early reassessment.     Follow up in 6 months provided FNA negative.  Follow up with Dr. Joaquin Courts.  PCP:  Virgie Dad 07/11/2015, 9:41 AM

## 2015-07-09 NOTE — Patient Instructions (Signed)
The patient is aware to call back for any questions or concerns. Continue self breast exams. Call office for any new breast issues or concerns. 

## 2015-07-10 ENCOUNTER — Telehealth: Payer: Self-pay | Admitting: *Deleted

## 2015-07-10 NOTE — Telephone Encounter (Signed)
Notified patient as instructed, no cancer cells on most recent FNA, patient pleased. Discussed follow-up appointments, patient agrees

## 2015-07-11 ENCOUNTER — Encounter: Payer: Self-pay | Admitting: General Surgery

## 2015-07-11 DIAGNOSIS — N6459 Other signs and symptoms in breast: Secondary | ICD-10-CM | POA: Insufficient documentation

## 2015-08-07 ENCOUNTER — Encounter: Payer: Self-pay | Admitting: General Surgery

## 2015-11-04 ENCOUNTER — Encounter: Payer: Self-pay | Admitting: *Deleted

## 2015-11-16 HISTORY — PX: CHEMICAL PEEL FACE: SUR201

## 2016-01-05 ENCOUNTER — Ambulatory Visit: Payer: 59 | Admitting: General Surgery

## 2016-01-21 ENCOUNTER — Encounter: Payer: Self-pay | Admitting: General Surgery

## 2016-01-27 ENCOUNTER — Ambulatory Visit: Payer: 59 | Admitting: General Surgery

## 2016-03-03 ENCOUNTER — Encounter: Payer: Self-pay | Admitting: General Surgery

## 2016-03-03 ENCOUNTER — Ambulatory Visit (INDEPENDENT_AMBULATORY_CARE_PROVIDER_SITE_OTHER): Payer: 59 | Admitting: General Surgery

## 2016-03-03 VITALS — BP 156/76 | HR 80 | Resp 12 | Ht 66.0 in | Wt 169.0 lb

## 2016-03-03 DIAGNOSIS — Z853 Personal history of malignant neoplasm of breast: Secondary | ICD-10-CM

## 2016-03-03 NOTE — Progress Notes (Signed)
Patient ID: Terri Morrison, female   DOB: [birth year], 60 y.o.   MRN: [id number]  Chief Complaint  Patient presents with  . Follow-up    mammogram    HPI Terri Morrison is a 60 y.o. female who presents for a breast evaluation. The most recent mammogram was done on 01/20/16.  Patient does perform regular self breast checks and gets regular mammograms done.  She states she had some breast tenderness after the mammogram but it seems to be better. She does complain of allergies.    Personal review the patient's history. HPI  Past Medical History  Diagnosis Date  . Asthma 2010  . Hypertension 2010  . Blood clot in vein 1978  . Acute mastitis of right breast 2013  . Malignant neoplasm of upper-outer quadrant of female breast [facility]) June 08, 2012    Right breast DCIS;1.5 cm; positive anterior, deep and inferior margins.Whole breast radiation. ER 5 percent, PR negative. The benefits of antiestrogen therapy were felt to be too low to warrant administration based on low ER value.  . Skin cancer 2016    chest, forehead, squamous cell    Past Surgical History  Procedure Laterality Date  . Tonsillectomy  1973  . Chemical peel face  Jan 2017  . Breast surgery Bilateral 1978    Breast augmentation  . Breast biopsy Right 2013    DCIS, ER less than 5%, PR negative.  Multiple positive margins identified after mastopexy making reexcision unreliable. Decision to complete whole breast radiation rather than proceed to mastectomy. Increased risk for recurrent disease reviewed with patient and family..  . Breast lumpectomy Right 2013  . Breast reconstruction Right 2013  . Breast reconstruction Left 2014    Family History  Problem Relation Age of Onset  . Cancer Sister 1    Breast    Social History Social History  Substance Use Topics  . Smoking status: Former Smoker -- 1.00 packs/day for 16 years    Types: Cigarettes  . Smokeless tobacco: Never Used  . Alcohol Use: 0.5 oz/week    1  drink(s) per week     Comment: Occasional drinker    Allergies  Allergen Reactions  . Cefdinir Hives  . Clindamycin/Lincomycin Hives    Current Outpatient Prescriptions  Medication Sig Dispense Refill  . albuterol (PROVENTIL HFA;VENTOLIN HFA) 108 (90 BASE) MCG/ACT inhaler Inhale into the lungs every 6 (six) hours as needed for wheezing or shortness of breath.    . fexofenadine (ALLEGRA) 180 MG tablet Take 180 mg by mouth 2 (two) times daily.    . Fluticasone-Salmeterol (ADVAIR) 250-50 MCG/DOSE AEPB Inhale 1 puff into the lungs 2 (two) times daily.    . hydroxychloroquine (PLAQUENIL) 200 MG tablet Take 200 mg by mouth 2 (two) times daily.    Marland Kitchen ibuprofen (ADVIL,MOTRIN) 600 MG tablet Take 600 mg by mouth every 8 (eight) hours as needed.     Marland Kitchen losartan (COZAAR) 100 MG tablet Take 100 mg by mouth daily.    . Melatonin 10 MG CAPS Take by mouth as needed.    . montelukast (SINGULAIR) 10 MG tablet Take 10 mg by mouth at bedtime.    . ranitidine (ZANTAC) 75 MG tablet Take 75 mg by mouth daily.    . simvastatin (ZOCOR) 20 MG tablet Take 20 mg by mouth every evening.     No current facility-administered medications for this visit.    Review of Systems Review of Systems  Constitutional: Negative.   Respiratory: Negative.  Cardiovascular: Negative.     Blood pressure 156/76, pulse 80, resp. rate 12, height 5\' 6"  (1.676 m), weight 169 lb (76.658 kg), last menstrual period 12/23/2002.  Physical Exam Physical Exam  Constitutional: She is oriented to person, place, and time. She appears well-developed and well-nourished.  HENT:  Mouth/Throat: Oropharynx is clear and moist.  Eyes: Conjunctivae are normal. No scleral icterus.  Neck: Neck supple.  Cardiovascular: Normal rate, regular rhythm and normal heart sounds.   Pulmonary/Chest: Effort normal. She has wheezes in the right upper field and the left upper field. Right breast exhibits tenderness. Right breast exhibits no inverted nipple, no  mass, no nipple discharge and no skin change. Left breast exhibits tenderness. Left breast exhibits no inverted nipple, no mass, no nipple discharge and no skin change.    Left breast slightly lower. Well healed incision at 6 o'clock right breast with telacangia with tenderness upper outer quadrant. Mild tenderness upper outer quadrant left breast.  Lymphadenopathy:    She has no cervical adenopathy.    She has no axillary adenopathy.  Neurological: She is alert and oriented to person, place, and time.  Skin: Skin is warm and dry.  Psychiatric: Her behavior is normal.    Data Reviewed Six-month follow-up right breast mammogram dated 01/20/2016 showed changes consistent with postsurgical scar. Less focal asymmetry evidence of free silicon within the breast parenchyma. BI-RADS-3.  Assessment    Stable breast exam. Mild, bilateral breast tenderness status post reconstruction.    Plan         Patient to have a bilateral diagnostic mammogram follow up in 6 months.    PCP:  [place] Pink This information has been scribed by Karie Fetch RN, BSN,BC.   Robert Bellow 03/04/2016, 4:15 PM

## 2016-03-03 NOTE — Patient Instructions (Addendum)
The patient is aware to call back for any questions or concerns. Patient to have a bilateral diagnostic mammogram follow up in 6 months.  

## 2016-03-04 ENCOUNTER — Encounter: Payer: Self-pay | Admitting: General Surgery

## 2016-05-13 ENCOUNTER — Encounter: Payer: Self-pay | Admitting: *Deleted

## 2016-06-08 ENCOUNTER — Encounter: Payer: Self-pay | Admitting: *Deleted

## 2016-06-30 ENCOUNTER — Encounter: Payer: Self-pay | Admitting: General Surgery

## 2016-07-01 ENCOUNTER — Encounter: Payer: Self-pay | Admitting: *Deleted

## 2016-07-07 ENCOUNTER — Ambulatory Visit (INDEPENDENT_AMBULATORY_CARE_PROVIDER_SITE_OTHER): Payer: 59 | Admitting: General Surgery

## 2016-07-07 ENCOUNTER — Encounter: Payer: Self-pay | Admitting: General Surgery

## 2016-07-07 VITALS — BP 148/82 | HR 62 | Resp 12 | Ht 66.0 in | Wt 172.0 lb

## 2016-07-07 DIAGNOSIS — D0511 Intraductal carcinoma in situ of right breast: Secondary | ICD-10-CM

## 2016-07-07 NOTE — Patient Instructions (Signed)
The patient is aware to call back for any questions or concerns.  

## 2016-07-07 NOTE — Progress Notes (Signed)
Patient ID: Terri Morrison, female   DOB: [birth year], 60 y.o.   MRN: [id number]  Chief Complaint  Patient presents with  . Follow-up    mammogram    HPI Terri Morrison is a 60 y.o. female who presents for a breast evaluation. The most recent mammogram was done on 06/30/16 .  Patient does perform regular self breast checks and gets regular mammograms done.   No new breast issues. She is enjoying retirement.   HPI  Past Medical History:  Diagnosis Date  . Acute mastitis of right breast 2013  . Asthma 2010  . Blood clot in vein 1978  . Hypertension 2010  . Malignant neoplasm of upper-outer quadrant of female breast [facility]) June 08, 2012   Right breast DCIS;1.5 cm; positive anterior, deep and inferior margins.Whole breast radiation. ER 5 percent, PR negative. The benefits of antiestrogen therapy were felt to be too low to warrant administration based on low ER value.  . Skin cancer 2016   chest, forehead, squamous cell    Past Surgical History:  Procedure Laterality Date  . BREAST BIOPSY Right 2013   DCIS, ER less than 5%, PR negative.  Multiple positive margins identified after mastopexy making reexcision unreliable. Decision to complete whole breast radiation rather than proceed to mastectomy. Increased risk for recurrent disease reviewed with patient and family..  . BREAST LUMPECTOMY Right 2013  . BREAST RECONSTRUCTION Right 2013  . BREAST RECONSTRUCTION Left 2014  . BREAST SURGERY Bilateral 1978   Breast augmentation  . CHEMICAL PEEL FACE  Jan 2017  . COLONOSCOPY  2015  . TONSILLECTOMY  1973    Family History  Problem Relation Age of Onset  . Cancer Sister 68    Breast    Social History Social History  Substance Use Topics  . Smoking status: Former Smoker    Packs/day: 1.00    Years: 16.00    Types: Cigarettes  . Smokeless tobacco: Never Used  . Alcohol use 0.5 oz/week    1 drink(s) per week     Comment: Occasional drinker    Allergies  Allergen Reactions   . Cefdinir Hives  . Clindamycin/Lincomycin Hives    Current Outpatient Prescriptions  Medication Sig Dispense Refill  . albuterol (PROVENTIL HFA;VENTOLIN HFA) 108 (90 BASE) MCG/ACT inhaler Inhale into the lungs every 6 (six) hours as needed for wheezing or shortness of breath.    . fexofenadine (ALLEGRA) 180 MG tablet Take 180 mg by mouth 2 (two) times daily.    . Fluticasone-Salmeterol (ADVAIR) 250-50 MCG/DOSE AEPB Inhale 1 puff into the lungs 2 (two) times daily.    . hydroxychloroquine (PLAQUENIL) 200 MG tablet Take 200 mg by mouth 2 (two) times daily.    Marland Kitchen ibuprofen (ADVIL,MOTRIN) 600 MG tablet Take 600 mg by mouth every 8 (eight) hours as needed.     Marland Kitchen losartan (COZAAR) 100 MG tablet Take 100 mg by mouth daily.    . Melatonin 10 MG CAPS Take by mouth as needed.    . montelukast (SINGULAIR) 10 MG tablet Take 10 mg by mouth at bedtime.    . ranitidine (ZANTAC) 75 MG tablet Take 75 mg by mouth daily.    . simvastatin (ZOCOR) 20 MG tablet Take 20 mg by mouth every evening.     No current facility-administered medications for this visit.     Review of Systems Review of Systems  Constitutional: Negative.   Respiratory: Negative.   Cardiovascular: Negative.     Blood pressure Marland Kitchen)  148/82, pulse 62, resp. rate 12, height 5\' 6"  (1.676 m), weight 172 lb (78 kg), last menstrual period 12/23/2002.  Physical Exam Physical Exam  Constitutional: She is oriented to person, place, and time. She appears well-developed and well-nourished.  Eyes: Conjunctivae are normal. No scleral icterus.  Neck: Neck supple.  Cardiovascular: Normal rate, regular rhythm and normal heart sounds.   Pulmonary/Chest: Effort normal and breath sounds normal. Right breast exhibits no inverted nipple, no mass, no nipple discharge, no skin change and no tenderness. Left breast exhibits no inverted nipple, no mass, no nipple discharge, no skin change and no tenderness.    Good breast symmetry.    Abdominal: Soft.  There is no tenderness.  Lymphadenopathy:    She has no cervical adenopathy.    She has no axillary adenopathy.  Neurological: She is alert and oriented to person, place, and time.  Skin: Skin is dry.    Data Reviewed [facility] mammograms dated 06/30/2016 were reviewed. Postsurgical changes noted. No interval change. BI-RADS-2.  Assessment    Stable breast exam. Status post excision DCIS with multiple positive margins post mastoplasty.    Plan    No indication of recurrent disease.    Patient to have a bilateral diagnostic mammogram follow up in 1 year.  This information has been scribed by Karie Fetch RN, BSN,BC.   Robert Bellow 07/07/2016, 12:15 PM

## 2016-07-22 ENCOUNTER — Ambulatory Visit: Payer: 59 | Admitting: General Surgery

## 2016-10-03 NOTE — Progress Notes (Signed)
[place]  [phone] [phone]) [phone]  ID: Terri Morrison OB: [birth year]  MR#: [id number]  [id number]  Patient Care Team: [place] Pink, MD as PCP - General (Family Medicine) Robert Bellow, MD (General Surgery) Gae Dry, MD as Referring Physician (Obstetrics and Gynecology)  CHIEF COMPLAINT: Right breast DCIS.  INTERVAL HISTORY: Patient is a 60 year old female who was initially diagnosed with a right breast DCIS in 2013. Subsequent lumpectomy revealed positive margins and it was ultimately determined and rather than reexcision to undergo whole breast radiation. Patient was told she did not need a medical oncologist at that time and was not placed on tamoxifen. Recently, she had a friend diagnosed with stage IV breast cancer which prompted her to ask her OB/GYN physician to draw a CA-27-29 which was mildly elevated at 41. Currently she is anxious but otherwise feels well. She has chronic hip pain secondary to arthritis. She has no neurologic complaints. She denies any recent fevers or illnesses. She has good appetite and denies weight loss. She has no chest pain or shortness of breath. She denies any nausea, vomiting, constipation, or diarrhea. She has no urinary complaints. Patient otherwise feels well and offers no further specific complaints.  REVIEW OF SYSTEMS:   Review of Systems  Constitutional: Negative.  Negative for fever, malaise/fatigue and weight loss.  Respiratory: Negative.  Negative for cough and shortness of breath.   Cardiovascular: Negative.  Negative for chest pain and leg swelling.  Gastrointestinal: Negative.  Negative for abdominal pain.  Genitourinary: Negative.   Musculoskeletal: Positive for joint pain.  Neurological: Negative.  Negative for weakness.  Psychiatric/Behavioral: The patient is nervous/anxious.     As per HPI. Otherwise, a complete review of systems is negative.  PAST MEDICAL HISTORY: Past Medical  History:  Diagnosis Date  . Acute mastitis of right breast 2013  . Asthma 2010  . Blood clot in vein 1978  . Hypertension 2010  . Malignant neoplasm of upper-outer quadrant of female breast [facility]) June 08, 2012   Right breast DCIS;1.5 cm; positive anterior, deep and inferior margins.Whole breast radiation. ER 5 percent, PR negative. The benefits of antiestrogen therapy were felt to be too low to warrant administration based on low ER value.  . Skin cancer 2016   chest, forehead, squamous cell    PAST SURGICAL HISTORY: Past Surgical History:  Procedure Laterality Date  . BREAST BIOPSY Right 2013   DCIS, ER less than 5%, PR negative.  Multiple positive margins identified after mastopexy making reexcision unreliable. Decision to complete whole breast radiation rather than proceed to mastectomy. Increased risk for recurrent disease reviewed with patient and family..  . BREAST LUMPECTOMY Right 2013  . BREAST RECONSTRUCTION Right 2013  . BREAST RECONSTRUCTION Left 2014  . BREAST SURGERY Bilateral 1978   Breast augmentation  . CHEMICAL PEEL FACE  Jan 2017  . COLONOSCOPY  2015  . TONSILLECTOMY  1973    FAMILY HISTORY: Family History  Problem Relation Age of Onset  . Cancer Sister 56    Breast    ADVANCED DIRECTIVES (Y/N):  N  HEALTH MAINTENANCE: Social History  Substance Use Topics  . Smoking status: Former Smoker    Packs/day: 1.00    Years: 16.00    Types: Cigarettes  . Smokeless tobacco: Never Used  . Alcohol use 0.5 oz/week    1 drink(s) per week     Comment: Occasional drinker     Colonoscopy:  PAP:  Bone density:  Lipid panel:  Allergies  Allergen Reactions  . Cefdinir Hives  . Clindamycin/Lincomycin Hives    Current Outpatient Prescriptions  Medication Sig Dispense Refill  . albuterol (PROVENTIL HFA;VENTOLIN HFA) 108 (90 BASE) MCG/ACT inhaler Inhale into the lungs every 6 (six) hours as needed for wheezing or shortness of breath.    . fexofenadine  (ALLEGRA) 180 MG tablet Take 180 mg by mouth 2 (two) times daily.    . Fluticasone-Salmeterol (ADVAIR) 250-50 MCG/DOSE AEPB Inhale 1 puff into the lungs 2 (two) times daily.    . hydroxychloroquine (PLAQUENIL) 200 MG tablet Take 200 mg by mouth 2 (two) times daily.    Marland Kitchen ibuprofen (ADVIL,MOTRIN) 600 MG tablet Take 600 mg by mouth every 8 (eight) hours as needed.     Marland Kitchen losartan (COZAAR) 100 MG tablet Take 100 mg by mouth daily.    . Melatonin 10 MG CAPS Take by mouth as needed.    . montelukast (SINGULAIR) 10 MG tablet Take 10 mg by mouth at bedtime.    . naproxen (NAPROSYN) 500 MG tablet Take by mouth.    . ranitidine (ZANTAC) 75 MG tablet Take 75 mg by mouth daily.    . simvastatin (ZOCOR) 20 MG tablet Take 20 mg by mouth every evening.     No current facility-administered medications for this visit.     OBJECTIVE: Vitals:   10/05/16 1012  BP: (!) 165/83  Pulse: 75  Resp: 18  Temp: 98.2 F (36.8 C)     Body mass index is 27.89 kg/m.    ECOG FS:0 - Asymptomatic  General: Well-developed, well-nourished, no acute distress. Eyes: Pink conjunctiva, anicteric sclera. HEENT: Normocephalic, moist mucous membranes, clear oropharnyx. Lungs: Clear to auscultation bilaterally. Heart: Regular rate and rhythm. No rubs, murmurs, or gallops. Abdomen: Soft, nontender, nondistended. No organomegaly noted, normoactive bowel sounds. Musculoskeletal: No edema, cyanosis, or clubbing. Neuro: Alert, answering all questions appropriately. Cranial nerves grossly intact. Skin: No rashes or petechiae noted. Psych: Normal affect. Lymphatics: No cervical, calvicular, axillary or inguinal LAD.   LAB RESULTS:  Lab Results  Component Value Date   NA 141 06/08/2012   K 4.0 06/08/2012   CL 110 (H) 06/08/2012   CO2 24 06/08/2012   GLUCOSE 105 (H) 06/08/2012   BUN 20 (H) 06/08/2012   CREATININE 0.72 06/08/2012   CALCIUM 8.7 06/08/2012   GFRNONAA >60 06/08/2012   GFRAA >60 06/08/2012    Lab Results    Component Value Date   WBC 4.9 09/25/2012   NEUTROABS 3.0 09/25/2012   HGB 13.8 09/25/2012   HCT 42.5 09/25/2012   MCV 89 09/25/2012   PLT 170 09/25/2012     STUDIES: No results found.  ASSESSMENT: Right breast DCIS.  PLAN:    1. Right breast DCIS:  ER is weakly positive and PR is negative. Patient underwent lumpectomy in July 2013 with multiple positive margins. It was ultimately determined and rather than reexcision to undergo whole breast radiation instead. Patient also underwent reconstruction.   Patient was told she did not need a medical oncologist at that time and was not placed on tamoxifen.  Recent CA-27-29 was found to be mildly elevated at 41. Unclear the clinical significance of this. After lengthy discussion with the patient, will repeat laboratory work and the first week in December or one month from her initial lab draw. If CA-27-29 is within normal limits, no further follow-up will be necessary. If it remains persistently elevated, patient will return to clinic to further discuss whether to pursue imaging  or continue to watch with periodic lab draws.  Approximately 45 minutes was spent in discussion of which greater than 50% was consultation.  erapy  Patient expressed understanding and was in agreement with this plan. She also understands that She can call clinic at any time with any questions, concerns, or complaints.   Ductal carcinoma in situ (DCIS) of right breast   Staging form: Breast, AJCC 7th Edition   - Clinical stage from 10/03/2016: Stage 0 (Tis (DCIS), N0, M0) - Signed by Lloyd Huger, MD on 10/03/2016  Lloyd Huger, MD   10/05/2016 2:11 PM

## 2016-10-05 ENCOUNTER — Inpatient Hospital Stay: Payer: BLUE CROSS/BLUE SHIELD | Attending: Oncology | Admitting: Oncology

## 2016-10-05 ENCOUNTER — Encounter (INDEPENDENT_AMBULATORY_CARE_PROVIDER_SITE_OTHER): Payer: Self-pay

## 2016-10-05 VITALS — BP 165/83 | HR 75 | Temp 98.2°F | Resp 18 | Ht 66.93 in | Wt 177.7 lb

## 2016-10-05 DIAGNOSIS — Z923 Personal history of irradiation: Secondary | ICD-10-CM

## 2016-10-05 DIAGNOSIS — Z79899 Other long term (current) drug therapy: Secondary | ICD-10-CM | POA: Diagnosis not present

## 2016-10-05 DIAGNOSIS — R978 Other abnormal tumor markers: Secondary | ICD-10-CM | POA: Diagnosis not present

## 2016-10-05 DIAGNOSIS — Z170 Estrogen receptor positive status [ER+]: Secondary | ICD-10-CM

## 2016-10-05 DIAGNOSIS — M1990 Unspecified osteoarthritis, unspecified site: Secondary | ICD-10-CM | POA: Insufficient documentation

## 2016-10-05 DIAGNOSIS — I10 Essential (primary) hypertension: Secondary | ICD-10-CM | POA: Insufficient documentation

## 2016-10-05 DIAGNOSIS — Z85828 Personal history of other malignant neoplasm of skin: Secondary | ICD-10-CM

## 2016-10-05 DIAGNOSIS — J45909 Unspecified asthma, uncomplicated: Secondary | ICD-10-CM | POA: Diagnosis not present

## 2016-10-05 DIAGNOSIS — Z87891 Personal history of nicotine dependence: Secondary | ICD-10-CM | POA: Insufficient documentation

## 2016-10-05 DIAGNOSIS — D0511 Intraductal carcinoma in situ of right breast: Secondary | ICD-10-CM | POA: Diagnosis not present

## 2016-10-05 NOTE — Progress Notes (Signed)
Patient has history of DCIS and received XRT in 2013.  She reports that Dr. Bary Castilla prescribed Tamoxifen about 1 year ago but she never took the medication.  She had a recent lab work including CA27.29 which was slightly elevated so she would like oncology evaluation.

## 2016-10-12 ENCOUNTER — Ambulatory Visit: Payer: Self-pay | Admitting: Oncology

## 2016-10-19 ENCOUNTER — Inpatient Hospital Stay: Payer: BLUE CROSS/BLUE SHIELD | Attending: Oncology

## 2016-10-19 DIAGNOSIS — D0511 Intraductal carcinoma in situ of right breast: Secondary | ICD-10-CM | POA: Insufficient documentation

## 2016-10-19 DIAGNOSIS — R978 Other abnormal tumor markers: Secondary | ICD-10-CM | POA: Insufficient documentation

## 2016-10-19 DIAGNOSIS — Z87891 Personal history of nicotine dependence: Secondary | ICD-10-CM | POA: Diagnosis not present

## 2016-10-19 DIAGNOSIS — Z85828 Personal history of other malignant neoplasm of skin: Secondary | ICD-10-CM | POA: Diagnosis not present

## 2016-10-19 DIAGNOSIS — Z923 Personal history of irradiation: Secondary | ICD-10-CM | POA: Insufficient documentation

## 2016-10-19 DIAGNOSIS — I10 Essential (primary) hypertension: Secondary | ICD-10-CM | POA: Diagnosis not present

## 2016-10-19 DIAGNOSIS — J45909 Unspecified asthma, uncomplicated: Secondary | ICD-10-CM | POA: Insufficient documentation

## 2016-10-19 DIAGNOSIS — Z170 Estrogen receptor positive status [ER+]: Secondary | ICD-10-CM | POA: Insufficient documentation

## 2016-10-19 DIAGNOSIS — M1990 Unspecified osteoarthritis, unspecified site: Secondary | ICD-10-CM | POA: Diagnosis not present

## 2016-10-19 DIAGNOSIS — Z79899 Other long term (current) drug therapy: Secondary | ICD-10-CM | POA: Diagnosis not present

## 2016-10-20 LAB — CANCER ANTIGEN 27.29: CA 27.29: 26.3 U/mL (ref 0.0–38.6)

## 2016-10-21 ENCOUNTER — Telehealth: Payer: Self-pay | Admitting: *Deleted

## 2016-10-21 NOTE — Telephone Encounter (Signed)
Patient called to request results of CA 27-29, CA 27-29 was 26.3 when drawn on 12/5. Patient given results and verbalized understanding of results.

## 2016-12-08 ENCOUNTER — Telehealth: Payer: Self-pay | Admitting: *Deleted

## 2016-12-08 NOTE — Telephone Encounter (Signed)
Called to report that she has been having pain in her lumbar spine area and saw her PCP who did xray's and told her it is arthritis. She is concerned because the pain is getting worse and is asking if in your opinion she needs to have a scan done. Please advise

## 2016-12-08 NOTE — Telephone Encounter (Signed)
Per Dr Grayland Ormond, this is most likely not related to her DCIS, She should be evaluated by orthopedics and maybe have an MRI. Patient infomred of this and will contact ortho

## 2017-07-04 ENCOUNTER — Telehealth: Payer: Self-pay | Admitting: *Deleted

## 2017-07-04 NOTE — Telephone Encounter (Signed)
Patient called the office today and states she had her mammogram at [facility]. They are recommending breast MRI per patient.   This patient is scheduled to follow up with Dr. Bary Castilla on 07-11-17.  Patient requesting that MRI be scheduled as soon as possible because she has a [place] that is due the end of the week and she will be providing care to her daughter after delivery.

## 2017-07-05 ENCOUNTER — Encounter: Payer: Self-pay | Admitting: General Surgery

## 2017-07-05 NOTE — Telephone Encounter (Signed)
PATIENT HAS CALLED TO CHECK TO SEE IF SHE WILL BE ABLE TO HAVE A BR MRI BEFORE BEING SEEN ON 07-11-17.PLEASE ADVISE.PATIENT IS VERY ANXIOUS!!!!!!!!!!!!!!

## 2017-07-05 NOTE — Telephone Encounter (Signed)
Mammograms from yesterday were reviewed and the radiologist report reviewed. Radiology has recommended an MRI to better assess the density developing in the radiation field and to clarify that residual silicon is responsible for the posterior densities. We will attempt to see if MRI can be completed.

## 2017-07-06 ENCOUNTER — Telehealth: Payer: Self-pay | Admitting: *Deleted

## 2017-07-06 NOTE — Telephone Encounter (Signed)
Message left for patient to call the office.   Insurance information we have on file says patient is not eligible.   We need to get current insurance information including policy number, group number, and telephone number for pre-certification.   *[facility] will not schedule breast MRI without insurance approval.

## 2017-07-06 NOTE — Telephone Encounter (Signed)
Patient called the office back with current [employer] information.   Gwenette Greet tried to obtain approval for breast MRI today but it does not meet criteria now based upon what information we currently have. We need office notes and an ultrasound.   Patient is currently scheduled to follow up with Dr. Bary Castilla on 07-11-17. MRI will be discussed further at that appointment time.

## 2017-07-06 NOTE — Telephone Encounter (Signed)
-----   Message from Georgana Curio sent at 07/06/2017 11:48 AM EDT ----- I am not sure why this has been handled via messaging, but please try to get the MRI scheduled at [facility].  Once you have a date, I can try to get it approved.   ----- Message ----- From: Robert Bellow, MD Sent: 07/05/2017   8:08 PM To: Georgana Curio  [facility] radiology as recommended an MRI. See if this can be approved. This would be completed at [facility]. ----- Message ----- From: Augustin Schooling, CMA Sent: 07/05/2017   3:29 PM To: Robert Bellow, MD

## 2017-07-11 ENCOUNTER — Ambulatory Visit: Payer: 59 | Admitting: General Surgery

## 2017-07-26 ENCOUNTER — Encounter: Payer: Self-pay | Admitting: General Surgery

## 2017-07-26 ENCOUNTER — Ambulatory Visit (INDEPENDENT_AMBULATORY_CARE_PROVIDER_SITE_OTHER): Payer: BLUE CROSS/BLUE SHIELD | Admitting: General Surgery

## 2017-07-26 VITALS — BP 144/78 | HR 86 | Resp 12 | Ht 65.0 in | Wt 150.0 lb

## 2017-07-26 DIAGNOSIS — D0511 Intraductal carcinoma in situ of right breast: Secondary | ICD-10-CM

## 2017-07-26 NOTE — Progress Notes (Signed)
Patient ID: Terri Morrison, female   DOB: 02-15-1956, 61 y.o.   MRN: [id number]  Chief Complaint  Patient presents with  . Follow-up    HPI Terri Morrison is a 61 y.o. female who presents for a breast evaluation. The most recent mammogram was done on 07/04/2017.  Patient does perform regular self breast checks and gets regular mammograms done.    HPI  Past Medical History:  Diagnosis Date  . Acute mastitis of right breast 2013  . Asthma 2010  . Blood clot in vein 1978  . Hypertension 2010  . Malignant neoplasm of upper-outer quadrant of female breast [facility]) June 08, 2012   Right breast DCIS;1.5 cm; positive anterior, deep and inferior margins.Whole breast radiation. ER 5 percent, PR negative. The benefits of antiestrogen therapy were felt to be too low to warrant administration based on low ER value.  . Skin cancer 2016   chest, forehead, squamous cell    Past Surgical History:  Procedure Laterality Date  . BREAST BIOPSY Right 2013   DCIS, ER less than 5%, PR negative.  Multiple positive margins identified after mastopexy making reexcision unreliable. Decision to complete whole breast radiation rather than proceed to mastectomy. Increased risk for recurrent disease reviewed with patient and family..  . BREAST LUMPECTOMY Right 2013  . BREAST RECONSTRUCTION Right 2013  . BREAST RECONSTRUCTION Left 2014  . BREAST SURGERY Bilateral 1978   Breast augmentation  . CHEMICAL PEEL FACE  Jan 2017  . COLONOSCOPY  2015  . TONSILLECTOMY  1973    Family History  Problem Relation Age of Onset  . Cancer Sister 86       Breast    Social History Social History  Substance Use Topics  . Smoking status: Former Smoker    Packs/day: 1.00    Years: 16.00    Types: Cigarettes  . Smokeless tobacco: Never Used  . Alcohol use 0.5 oz/week    1 Standard drinks or equivalent per week     Comment: Occasional drinker    Allergies  Allergen Reactions  . Cefdinir Hives  .  Clindamycin/Lincomycin Hives    Current Outpatient Prescriptions  Medication Sig Dispense Refill  . albuterol (PROVENTIL HFA;VENTOLIN HFA) 108 (90 BASE) MCG/ACT inhaler Inhale into the lungs every 6 (six) hours as needed for wheezing or shortness of breath.    . fexofenadine (ALLEGRA) 180 MG tablet Take 180 mg by mouth 2 (two) times daily.    . Fluticasone-Salmeterol (ADVAIR) 250-50 MCG/DOSE AEPB Inhale 1 puff into the lungs 2 (two) times daily.    . hydroxychloroquine (PLAQUENIL) 200 MG tablet Take 200 mg by mouth 2 (two) times daily.    Marland Kitchen ibuprofen (ADVIL,MOTRIN) 600 MG tablet Take 600 mg by mouth every 8 (eight) hours as needed.     Marland Kitchen losartan (COZAAR) 100 MG tablet Take 100 mg by mouth daily.    . Melatonin 10 MG CAPS Take by mouth as needed.    . montelukast (SINGULAIR) 10 MG tablet Take 10 mg by mouth at bedtime.    . ranitidine (ZANTAC) 75 MG tablet Take 75 mg by mouth daily.    . simvastatin (ZOCOR) 20 MG tablet Take 20 mg by mouth every evening.     No current facility-administered medications for this visit.     Review of Systems Review of Systems  Constitutional: Negative.   Respiratory: Negative.   Cardiovascular: Negative.     Blood pressure (!) 144/78, pulse 86, resp. rate 12, height 5'  5" (1.651 m), weight 150 lb (68 kg), last menstrual period 12/23/2002.  Physical Exam Physical Exam  Constitutional: She is oriented to person, place, and time. She appears well-developed and well-nourished.  Eyes: Conjunctivae are normal. No scleral icterus.  Neck: Neck supple.  Cardiovascular: Normal rate, regular rhythm and normal heart sounds.   Pulmonary/Chest: Effort normal and breath sounds normal. Right breast exhibits no inverted nipple, no mass, no nipple discharge, no skin change and no tenderness. Left breast exhibits tenderness (left breast tenderness at 3  o'clcok. ). Left breast exhibits no inverted nipple, no mass, no nipple discharge and no skin change. Breasts are  asymmetrical.    Right breast denity and scaring. No change since last visit.   Lymphadenopathy:    She has no cervical adenopathy.    She has no axillary adenopathy.  Neurological: She is alert and oriented to person, place, and time.  Skin: Skin is warm and dry.    Data Reviewed Bilateral diagnostic mammograms dated 07/04/2017 completed at [facility] in [place] reported postsurgical changes bilaterally. On the right side visualized material thought to possibly represent residual silicone was noted well in the posterior aspect of the right breast. MRI evaluation recommended to evaluate this area. BI-RADS-2.   Assessment    Gradual developing density in the posterior aspect of the right breast on serial mammograms.  Past history of DCIS with positive margin status post open breast radiation.      Plan    The radiologist has described an interval change over the last 5 years. This may simply be postsurgical scarring, possibility for malignancy is present.  We will contact her insurance company to determine if an MRI can be obtained.   The patient has been asked to return to the office in one year with a bilateral diagnostic mammogram.  HPI, Physical Exam, Assessment and Plan have been scribed under the direction and in the presence of Hervey Ard, MD.  Gaspar Cola, CMA  Hernia precautions and incarceration were discussed with the patient. If they develop symptoms of an incarcerated hernia, they were encouraged to seek prompt medical attention.  I have recommended repair of the hernia using mesh on an outpatient basis in the near future. The risk of infection was reviewed. The role of prosthetic mesh to minimize the risk of recurrence was reviewed.  Robert Bellow 07/26/2017, 5:55 PM

## 2017-07-27 ENCOUNTER — Other Ambulatory Visit: Payer: Self-pay

## 2017-07-27 ENCOUNTER — Telehealth: Payer: Self-pay

## 2017-07-27 DIAGNOSIS — D0511 Intraductal carcinoma in situ of right breast: Secondary | ICD-10-CM

## 2017-07-27 NOTE — Telephone Encounter (Signed)
-----   Message from Robert Bellow, MD sent at 07/26/2017  5:58 PM EDT ----- Please investigate if we can arrange for a breast MRI at [facility] regarding abnormal [facility] diagnostic mammogram from August 2018.

## 2017-07-27 NOTE — Telephone Encounter (Signed)
Patient contacted and is amendable to having a breast MRI. The patient is scheduled for a breast MRI at [facility] on 07/28/17 at 10:00 am. She is aware of date, time, and instructions.

## 2017-08-10 ENCOUNTER — Telehealth: Payer: Self-pay

## 2017-08-10 NOTE — Telephone Encounter (Signed)
Notified patient as instructed, patient pleased. Discussed follow-up appointments, patient agrees. She is fine with not repeating the MRI. She will follow up in August next year.

## 2017-08-10 NOTE — Telephone Encounter (Signed)
-----   Message from Robert Bellow, MD sent at 08/09/2017  9:45 PM EDT ----- Please notify the patient that the radiologist did not identify any problems but reported motion artifact for which they have suggested the study be repeated. See if she is willing to give it another go.

## 2017-09-19 ENCOUNTER — Telehealth: Payer: Self-pay

## 2017-09-19 NOTE — Telephone Encounter (Signed)
Notified patient as instructed, patient pleased. Discussed follow-up appointments, patient agrees  

## 2017-09-19 NOTE — Telephone Encounter (Signed)
-----   Message from Robert Bellow, MD sent at 09/19/2017  3:10 PM EST ----- Please let the patient know the f/u MRI report is fine.  ----- Message ----- From: Augustin Schooling, CMA Sent: 09/19/2017   8:44 AM To: Robert Bellow, MD

## 2018-01-23 ENCOUNTER — Ambulatory Visit: Payer: BLUE CROSS/BLUE SHIELD | Admitting: Physical Therapy

## 2018-07-13 ENCOUNTER — Ambulatory Visit: Payer: BLUE CROSS/BLUE SHIELD | Admitting: General Surgery

## 2018-07-27 ENCOUNTER — Encounter: Payer: Self-pay | Admitting: General Surgery

## 2018-08-17 ENCOUNTER — Ambulatory Visit: Payer: BLUE CROSS/BLUE SHIELD | Admitting: General Surgery

## 2018-10-11 ENCOUNTER — Encounter: Payer: Self-pay | Admitting: *Deleted

## 2019-06-05 ENCOUNTER — Encounter: Payer: Self-pay | Admitting: General Surgery

## 2019-08-17 ENCOUNTER — Telehealth: Payer: Self-pay | Admitting: Surgery

## 2019-08-20 ENCOUNTER — Ambulatory Visit: Payer: BLUE CROSS/BLUE SHIELD | Admitting: Surgery

## 2019-08-27 NOTE — Telephone Encounter (Signed)
The patient will reschedule her mammogram with [facility] and will call back to reschedule her follow up with Dr Dahlia Byes.

## 2019-08-27 NOTE — Telephone Encounter (Signed)
This patient called last week and was very confused a letter that she had received in the mail that was sent to her from [place]. She is not sure if she should get a MR done or a Mammo or if she needs to have either done per her last visit with Dr Bary Castilla. Please call patient back concerning her recall.

## 2019-09-05 ENCOUNTER — Ambulatory Visit: Payer: Medicare HMO | Attending: Obstetrics and Gynecology

## 2019-09-05 ENCOUNTER — Other Ambulatory Visit: Payer: Self-pay

## 2019-09-05 DIAGNOSIS — N393 Stress incontinence (female) (male): Secondary | ICD-10-CM | POA: Diagnosis present

## 2019-09-05 DIAGNOSIS — M25552 Pain in left hip: Secondary | ICD-10-CM | POA: Diagnosis present

## 2019-09-05 DIAGNOSIS — M6281 Muscle weakness (generalized): Secondary | ICD-10-CM | POA: Diagnosis present

## 2019-09-05 NOTE — Therapy (Signed)
[place] [facility] [address] [place], [place], [address] Phone: [phone]   Fax:  [phone]  Physical Therapy Evaluation  The patient has been informed of current processes in place at Outpatient Rehab to protect patients from Covid-19 exposure including social distancing, schedule modifications, and new cleaning procedures. After discussing their particular risk with a therapist based on the patient's personal risk factors, the patient has decided to proceed with in-person therapy.  Patient Details  Name: JERNI DARWIN MRN: [id number] Date of Birth: 1955-12-01 No data recorded  Encounter Date: 09/05/2019  PT End of Session - 09/05/19 1734    Visit Number  1    Number of Visits  10    Date for PT Re-Evaluation  11/14/19    PT Start Time  1630    PT Stop Time  1730    PT Time Calculation (min)  60 min    Activity Tolerance  Patient tolerated treatment well    Behavior During Therapy  [facility] for tasks assessed/performed       Past Medical History:  Diagnosis Date  . Acute mastitis of right breast 2013  . Asthma 2010  . Blood clot in vein 1978  . Hypertension 2010  . Malignant neoplasm of upper-outer quadrant of female breast [facility]) June 08, 2012   Right breast DCIS;1.5 cm; positive anterior, deep and inferior margins.Whole breast radiation. ER 5 percent, PR negative. The benefits of antiestrogen therapy were felt to be too low to warrant administration based on low ER value.  . Skin cancer 2016   chest, forehead, squamous cell    Past Surgical History:  Procedure Laterality Date  . BREAST BIOPSY Right 2013   DCIS, ER less than 5%, PR negative.  Multiple positive margins identified after mastopexy making reexcision unreliable. Decision to complete whole breast radiation rather than proceed to mastectomy. Increased risk for recurrent disease reviewed with patient and family..  . BREAST LUMPECTOMY Right 2013  . BREAST  RECONSTRUCTION Right 2013  . BREAST RECONSTRUCTION Left 2014  . BREAST SURGERY Bilateral 1978   Breast augmentation  . CHEMICAL PEEL FACE  Jan 2017  . COLONOSCOPY  2015  . TONSILLECTOMY  1973    There were no vitals filed for this visit.     Pelvic Floor Physical Therapy Evaluation and Assessment  SCREENING  Falls in last 6 mo: No   Interpreter Needed?: No  Interpreter Agency:  Interpreter Name  Interpreter ID  Patient Declined Interpreter   Patient signed [place] waiver    Patient's communication preference:  English  Red Flags:  Have you had any night sweats? No (has hot flashes during the day that has increased).  Unexplained weight loss? No  Saddle anesthesia? No  Unexplained changes in bowel or bladder habits? No   SUBJECTIVE  Patient reports: Diagnosed with prolapsed bladder by Dr. Leafy Ro. She states she has had this sensation like prolapse for the last few years. She states she has L hip and back pain that has increased in the last 6 months. 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 Feels her prolapse when she "tinkles or wipes". When she walks around a lot it gets worse. It feels very uncomfortable. It bothered her [place] the zoo a [place]. Feels like she has a UTI.   Feels like she can't empty her bladder;  [place] (goes 3-4 a night); only 1-2 hours of interrupted sleep. Had an internal vaginal [place] at [facility] which she said was negative.   She says she always has liked to be active- does a lot of lifting.  She wants to be able to pick up her grandbabies.   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  With coughing or hard sneeze: small leakage.   Gastrointestinal History: Bowel habits- new in the last 6 months. Going for a colonoscopy, Incomplete voiding. Has not been drinking water very often due to urgency.   Sexual activity/pain: No pain during sex.   Location of pain 1: pressure at the urethra/vulva Current pain:  0/10  Max pain:  9-10/10- does not occur every day; but sometimes it with flare up.  Least pain:  0/10 Nature of pain: pressure   Location of pain 2: Low back pain (related)- achy; L side and hip Current Pain: 3/10  Max Pain: 6-7/10 Least pain: 0/10 on NPS; resolves completely at times.   Uses a hot pack and an ibuprofen for treatment.   Patient Goals: Be able to lift grandkids and not inhibit family activities.    OBJECTIVE  Posture/Observations:   Sitting: preference for crossing legs.  Standing: R PSIS slightly higher in standing; slight anterior pelvic rotation; preference for narrow BOS  Palpation/Segmental Motion/Joint Play:  L >R pectineus  L>R psoas L>R illiacus  **L OI (external)   Special tests:    Range of Motion/Flexibilty:  Spine: B 20% of rotation ROM; B SL to one finger above knee. (no radicular symptoms elicited)  Hips: R PSIS slightly higher in standing; slightly longer visually with supine to sit leg length test.    Strength/MMT:  deferred to next session  LE MMT  LE MMT Left Right  Hip flex:  (L2) /5 /5  Hip ext: /5 /5  Hip abd: /5 /5  Hip add: /5 /5  Hip IR /5 /5  Hip ER /5 /5     Abdominal:  Palpation: restriction at B lower quadrants fascially  Diastasis: no present   Pelvic Floor: Deferred to follow-up  External Exam: Introitus Appears:  Skin integrity:  Palpation: Cough: Prolapse visible?: Scar mobility:  Internal Vaginal Exam: Strength (PERF):  Symmetry: Palpation: Prolapse:   Internal Rectal  Exam: Strength (PERF): Symmetry: Palpation: Prolapse:   Gait Analysis: decreased truncal rotation with arm swing 1.15m/s     Pelvic Floor Outcome Measures: PFQ 13/72; PFIQ 112.39/300   INTERVENTIONS THIS SESSION: ThereAct: Sit <>Standing training to reduce intraabdominal pressure; log rolling for supine <> sit to reduce intraabdominal pressure, and diaphragmatic breathing with coordination training. All therapeutic activities performed in order to improve coordination of diaphragm and PFM in order to reduce symptoms and reduce risk of progression of prolapse.    Self-care: Educated on the structure and function of the pelvic floor in relation to their symptoms as well as the POC, and initial HEP in order to set patient expectations and understanding from which we will build on in the future sessions.   Total time: 60 minutes                 Objective measurements completed on examination: See above  findings.                PT Short Term Goals - 09/05/19 1811      PT SHORT TERM GOAL #1   Title  Patient will demonstrate a coordinated contraction, relaxation, and bulge of the pelvic floor muscles to demonstrate functional recruitment and motion and allow for further strengthening.    Baseline  09-05-19: Pt demonstrates paradoxial breathing.    Time  5    Period  Weeks    Status  New    Target Date  10/10/19      PT SHORT TERM GOAL #2   Title  Patient will demonstrate functional recruitment of TA with breathing, sit-to-stand, squatting/lifting, and walking to allow for improved pelvic brace coordination, improved balance, and decreased downward pressure on the pelvic organs.    Baseline  09-05-19: Patient demonstrates increased in intrabdominal pressure with sit<>stand; supine<>sitting and restful breathing.    Time  5    Period  Weeks    Status  New    Target Date  10/10/19      PT SHORT TERM GOAL #3   Title  Patient will describe feeling of pressure  no more than 50% of the time over the course of the past week to demonstrate improved recruitment and strength of the pelvic floor.    Baseline  09-05-19 Pressures is debiliating and is felt almost 100% of the time and increases with functional lifting/walking activities.    Time  5    Period  Weeks    Status  New    Target Date  10/10/19        PT Long Term Goals - 09/05/19 1814      PT LONG TERM GOAL #1   Title  Patient will demonstrate appropriate body mechanics with bending and lifting heavy objects to allow for decreased stress on the pelvic floor and low back.    Baseline  09-05-19 Pt reports most severe symptoms of prolapse with lifting/bending.    Time  10    Period  Weeks    Status  New    Target Date  11/14/19      PT LONG TERM GOAL #2   Title  Patient will describe pressure no greater than 6/10 prolapse during lifting to demonstrate improved functional ability.    Baseline  09-05-19 Pressure at bladder arrises to 9-10/10 on NPS with functional activities.    Time  10    Period  Weeks    Status  New    Target Date  11/14/19      PT LONG TERM GOAL #3   Title  Patient will describe pain at L hip/low back no greater than 7/10 during functional activities  to demonstrate improved functional ability.    Baseline  09-05-19 Pt has low back and hip pain L>R that inhibits  functional activities.    Time  10    Period  Weeks    Status  New    Target Date  11/14/19      PT LONG TERM GOAL #4   Title  Patient will score at or below 80/300 on the PFIQ to demonstrate a clinically significant decrease in disability and improved functional ability.    Baseline  112/300 (33/100 POPDI-6; 12.49/100 CRADI-8; 66.6/100 UDI-6) on the PFIQ    Time  10    Period  Weeks    Status  New    Target Date  11/14/19  Plan - 09/05/19 1734    Clinical Impression Statement  Patient is a 63 y.o F who arrives to PT with c/o of frequency urinary urgency, occasssional stress  incontience, L hip/back pain and incomplete bowel/bladder voiding. Patient has a relavant PMH of previous breast CA wtih (R lumpectomy) and asthma. Patient will benefit from skilled pelvic health PT to address patients goals, improve urinary urgency, stress incontience, nocurtina, and reduce pain long term.    Personal Factors and Comorbidities  Age    Examination-Activity Limitations  Toileting;Sleep    Examination-Participation Restrictions  Community Activity;Interpersonal Relationship    Stability/Clinical Decision Making  Stable/Uncomplicated    Clinical Decision Making  Moderate    Rehab Potential  Good    PT Frequency  1x / week    PT Duration  Other (comment)   10 weeks   PT Treatment/Interventions  ADLs/Self Care Home Management;Biofeedback;Electrical Stimulation;Cryotherapy;Therapeutic activities;Therapeutic exercise;Functional mobility training;Neuromuscular re-education;Patient/family education;Manual techniques;Dry needling;Joint Manipulations;Passive range of motion    PT Next Visit Plan  Reassess diaphramatic breathing, sit<>stand, log rolling with decreased intraabdominal pressure    PT Home Exercise Plan  diaphramatic breathing, STS, log rolling.    Consulted and Agree with Plan of Care  Patient       Patient will benefit from skilled therapeutic intervention in order to improve the following deficits and impairments:  Decreased coordination, Decreased range of motion, Decreased activity tolerance, Increased muscle spasms, Increased fascial restricitons, Impaired flexibility, Improper body mechanics  Visit Diagnosis: Urinary incontinence without sensory awareness  Physical therapy evaluation, initial  Pain in left hip     Problem List Patient Active Problem List   Diagnosis Date Noted  . Breast thickening 07/11/2015  . Mastalgia 12/27/2014  . History of right breast cancer 07/09/2014  . Asthma in adult 12/23/2012  . Ductal carcinoma in situ (DCIS) of right breast  12/23/2012    Adaiah Morken, SPT  09/05/2019, 6:22 PM   This entire session was performed under direct supervision and direction of a licensed therapist/therapist assistant . I have personally read, edited and approve of the note as written.  Willa Rough DPT, [place] [facility] [address] [place], [place], [address] Phone: [phone]   Fax:  [phone]  Name: BRYAHNA ROATH MRN: [id number] Date of Birth: February 21, 1956

## 2019-09-05 NOTE — Patient Instructions (Signed)
Getting In/out of bed     Lying on back, bend left knee and place left arm across chest. Roll all in one movement to the right. Reverse to roll to the left. Always move as one unit.     Once you are lying on you side, move legs to edge of bed. Pull in the pelvic floor and lower tummy and push down with both hands while moving legs off bed to reach sitting position.  *Reverse sequence to return to lying down.  Copyright  VHI. All rights reserved.     Sit, feet flat, scoot forward to the edge of the chair. Inhale as you bend forward at hips, begin to exhale just before and while you stand, contracting the glutes, lower tummy muscles and pelvic floor as if stopping urination as you stand up.   * Do this every time you sit or stand! If you catch yourself doing it "wrong, re-set and do it again so it can become habit!    Urge supression technique:  1) Take a deep breath to convince yourself that you are in control and calm the nervous system.  2) Do 5 "quick-flick" kegels (pelvic floor muscle contractions) and re-assess the urge. Repeat another set if urge is still present. 3) Once the urge has decreased, start walking calmly to the the bathroom. Stop and repeat steps 1 and 2 as many times as needed until you can successfully get to the toilet. 4) Only once seated, take a deep breath and allow the pelvic floor muscles to relax and allow for the urine to flow.    Do not be discouraged if you are not successful the first couple times, this is normal and it will take practice but remember that YOU are in control. Start by practicing this at home where you do not have to worry as much if there were to be an accident. Allowing yourself to get rushed or nervous puts the bladder back in control and will not allow the technique to work.

## 2019-09-12 ENCOUNTER — Other Ambulatory Visit: Payer: Self-pay

## 2019-09-12 ENCOUNTER — Ambulatory Visit: Payer: Medicare HMO

## 2019-09-12 DIAGNOSIS — M25552 Pain in left hip: Secondary | ICD-10-CM

## 2019-09-12 DIAGNOSIS — M6281 Muscle weakness (generalized): Secondary | ICD-10-CM

## 2019-09-12 DIAGNOSIS — N393 Stress incontinence (female) (male): Secondary | ICD-10-CM

## 2019-09-12 NOTE — Patient Instructions (Signed)
   EXhale on EXertion!!!!! (pushing, pulling, lifting, standing, etc.)  Whenever you exert force you need to exhale to allow the pressure to escape out of your vocal cords rather than be pressed down through your pelvic floor. Exhaling also helps engage your deep-core muscles to protect your back and pelvic floor. *think about this when you pick up your granddaughter- exhale prior to picking her up. And have your granddaughter jump to assist.     1. Stabilization: Diaphragmatic Breathing    Lie with knees bent, feet flat. Place one hand on stomach, other on chest. Breathe deeply through nose, lifting belly hand without any motion of hand on chest. Repeat __10__ times per set. Do __2__ sets per session. Perform everyday.  *Inhale- smell the roses; exhale blow out the candles   2. (the progression of above) Pelvic Tilt With Pelvic Floor (Hook-Lying)        Lie with hips and knees bent. Squeeze pelvic floor and flatten low back while breathing out so that pelvis tilts. Repeat _10__ times. Do _2__ times a day.  **On the exhale, bring your pelvic floor muscles up and in (like plucking a blueberry)- only on the exhale portion.   Put a pillow under your hips in this position when doing this exercise to help decrease pressure in the pelvis when it feels "heavy".   3. Getting In/out of bed - practice every time. Exhale when you roll to your side and exhale when you push to sit up. (I.e. exhale on exertion)    Lying on back, bend left knee and place left arm across chest. Roll all in one movement to the right. Reverse to roll to the left. Always move as one unit.     Once you are lying on you side, move legs to edge of bed. Pull in the pelvic floor and lower tummy and push down with both hands while moving legs off bed to reach sitting position.  *Reverse sequence to return to lying down.  Copyright  VHI. All rights reserved.     Copyright  VHI. All rights reserved.         4. Sitting up and down: Sit, feet flat, scoot forward to the edge of the chair. Inhale as you bend forward at hips, begin to exhale just before and while you stand, contracting the glutes, lower tummy muscles and pelvic floor as if stopping urination as you stand up. (exhale as you push up and exhale as you go to sit back down)   * Do this every time you sit or stand! If you catch yourself doing it "wrong, re-set and do it again so it can become habit!   Good muscle memory- do it right every time!

## 2019-09-12 NOTE — Therapy (Signed)
[place] MAIN [facility] SERVICES [address] [place], [place], [address] Phone: [phone]   Fax:  [phone]  Physical Therapy Treatment The patient has been informed of current processes in place at Outpatient Rehab to protect patients from Covid-19 exposure including social distancing, schedule modifications, and new cleaning procedures. After discussing their particular risk with a therapist based on the patient's personal risk factors, the patient has decided to proceed with in-person therapy.  Patient Details  Name: Terri Morrison MRN: [id number] Date of Birth: May 24, 1956 No data recorded  Encounter Date: 09/12/2019  PT End of Session - 09/12/19 1746    Visit Number  2    Number of Visits  10    Date for PT Re-Evaluation  11/14/19    Authorization - Visit Number  2    Authorization - Number of Visits  10    PT Start Time  [id number]    PT Stop Time  1730    PT Time Calculation (min)  55 min    Activity Tolerance  Patient tolerated treatment well    Behavior During Therapy  [facility] for tasks assessed/performed       Past Medical History:  Diagnosis Date  . Acute mastitis of right breast 2013  . Asthma 2010  . Blood clot in vein 1978  . Hypertension 2010  . Malignant neoplasm of upper-outer quadrant of female breast [facility]) June 08, 2012   Right breast DCIS;1.5 cm; positive anterior, deep and inferior margins.Whole breast radiation. ER 5 percent, PR negative. The benefits of antiestrogen therapy were felt to be too low to warrant administration based on low ER value.  . Skin cancer 2016   chest, forehead, squamous cell    Past Surgical History:  Procedure Laterality Date  . BREAST BIOPSY Right 2013   DCIS, ER less than 5%, PR negative.  Multiple positive margins identified after mastopexy making reexcision unreliable. Decision to complete whole breast radiation rather than proceed to mastectomy. Increased risk for recurrent disease reviewed  with patient and family..  . BREAST LUMPECTOMY Right 2013  . BREAST RECONSTRUCTION Right 2013  . BREAST RECONSTRUCTION Left 2014  . BREAST SURGERY Bilateral 1978   Breast augmentation  . CHEMICAL PEEL FACE  Jan 2017  . COLONOSCOPY  2015  . TONSILLECTOMY  1973    There were no vitals filed for this visit.      Pelvic Floor Physical Therapy Treatment Note  SCREENING  Changes in medications, allergies, or medical history?: No    SUBJECTIVE  Patient reports: Would like to be able to do a Kegel correctly. Saw an expert in urinary gynecology- medical note available. Picked up grandchild all weekend and felt increased in symptoms. Reports practicing a Kegel 15x while urinating.   Precautions:  N/A  Pain update: LBP- L side; pressure at urethra/vulva   Location of pain: L LBP/ hip  Current pain:  1-2/10  Max pain:  6/10 Least pain:  0/10 Nature of pain: deep ache  Increased awareness of bladder full-ness this week.    Patient Goals: Be able to lift grandkids and not inhibit family activities.    OBJECTIVE  Changes in: Posture/Observations:  Preference for anterior pelvic tilt. Preference for paradoxical breathing.  L upslip in supine  Range of Motion/Flexibilty:    Strength/MMT:  LE MMT: External PFM assessment- 1/5 MMT (external assessment)  No tenderness at B STP or IC; bulbocavernosus not assessed.    From Medical from Dr. Durel Salts note:  Pelvic floor: kegel strength 2/5 MMT from note (internal assessment)  -anterior wall is grade 3 prolapse  - uterus is 1 prolapse  -posterior wall is 2 prolapse   Abdominal: N/A   Palpation: TTP at L QL  TTP at L glute med   Gait Analysis: N/A   INTERVENTIONS THIS SESSION: Manual: L QL TP release performed, which decreased the L up-slip following to decrease spasm and pain and allow for improved balance of musculature for improved function and decreased symptoms. (10 minutes)   ThereEx: Re-education and  performance of diaphragmatic breathing with and without pelvic tilts (pt most receptive to visual and tactile cues). Education and performance of Kegel with diaphragmatic breathing and corrected paradoxical breathing. (23 minutes)   ThereAct: Performance and re-education of sit<>stand and log rolling with diaphragmatic breathing to reduce abnormal increase in intraabdominal pressure to reduce prolapse symptoms. (22 minutes)     Total time: 55 minutes                           PT Short Term Goals - 09/05/19 1811      PT SHORT TERM GOAL #1   Title  Patient will demonstrate a coordinated contraction, relaxation, and bulge of the pelvic floor muscles to demonstrate functional recruitment and motion and allow for further strengthening.    Baseline  09-05-19: Pt demonstrates paradoxial breathing.    Time  5    Period  Weeks    Status  New    Target Date  10/10/19      PT SHORT TERM GOAL #2   Title  Patient will demonstrate functional recruitment of TA with breathing, sit-to-stand, squatting/lifting, and walking to allow for improved pelvic brace coordination, improved balance, and decreased downward pressure on the pelvic organs.    Baseline  09-05-19: Patient demonstrates increased in intrabdominal pressure with sit<>stand; supine<>sitting and restful breathing.    Time  5    Period  Weeks    Status  New    Target Date  10/10/19      PT SHORT TERM GOAL #3   Title  Patient will describe feeling of pressure no more than 50% of the time over the course of the past week to demonstrate improved recruitment and strength of the pelvic floor.    Baseline  09-05-19 Pressures is debiliating and is felt almost 100% of the time and increases with functional lifting/walking activities.    Time  5    Period  Weeks    Status  New    Target Date  10/10/19        PT Long Term Goals - 09/05/19 1814      PT LONG TERM GOAL #1   Title  Patient will demonstrate appropriate body  mechanics with bending and lifting heavy objects to allow for decreased stress on the pelvic floor and low back.    Baseline  09-05-19 Pt reports most severe symptoms of prolapse with lifting/bending.    Time  10    Period  Weeks    Status  New    Target Date  11/14/19      PT LONG TERM GOAL #2   Title  Patient will describe pressure no greater than 6/10 prolapse during lifting to demonstrate improved functional ability.    Baseline  09-05-19 Pressure at bladder arrises to 9-10/10 on NPS with functional activities.    Time  10    Period  Weeks  Status  New    Target Date  11/14/19      PT LONG TERM GOAL #3   Title  Patient will describe pain at L hip/low back no greater than 7/10 during functional activities  to demonstrate improved functional ability.    Baseline  09-05-19 Pt has low back and hip pain L>R that inhibits  functional activities.    Time  10    Period  Weeks    Status  New    Target Date  11/14/19      PT LONG TERM GOAL #4   Title  Patient will score at or below 80/300 on the PFIQ to demonstrate a clinically significant decrease in disability and improved functional ability.    Baseline  112/300 (33/100 POPDI-6; 12.49/100 CRADI-8; 66.6/100 UDI-6) on the PFIQ    Time  10    Period  Weeks    Status  New    Target Date  11/14/19            Plan - 09/12/19 1748    Clinical Impression Statement  Pt. Responded well to all interventions today, demonstrating improved breathing coordination with improve diaphragmatic breathing in coordination with pelvic tilts and PFM contraction appropriately, improved pelvic alignment following TTP release, as well as understanding and correct performance of all education and exercises provided today. They will continue to benefit from skilled physical therapy to work toward remaining goals and maximize function as well as decrease likelihood of symptom increase or recurrence.     Personal Factors and Comorbidities  Age     Examination-Activity Limitations  Toileting;Sleep    Examination-Participation Restrictions  Community Activity;Interpersonal Relationship    Stability/Clinical Decision Making  Evolving/Moderate complexity    Rehab Potential  Good    PT Frequency  1x / week    PT Duration  Other (comment)   10 weeks   PT Treatment/Interventions  ADLs/Self Care Home Management;Biofeedback;Electrical Stimulation;Cryotherapy;Therapeutic activities;Therapeutic exercise;Functional mobility training;Neuromuscular re-education;Patient/family education;Manual techniques;Dry needling;Joint Manipulations;Passive range of motion;Spinal Manipulations    PT Next Visit Plan  Reassess diaphramatic breathing, sit<>stand, log rolling with decreased intraabdominal pressure; deep core stability    PT Home Exercise Plan  diaphramatic breathing (with and without pelvic tilt- inititate kegel contraction), STS, log rolling, urge suppression.    Consulted and Agree with Plan of Care  Patient       Patient will benefit from skilled therapeutic intervention in order to improve the following deficits and impairments:  Decreased coordination, Decreased range of motion, Decreased activity tolerance, Increased muscle spasms, Increased fascial restricitons, Impaired flexibility, Improper body mechanics  Visit Diagnosis: Muscle weakness (generalized)  Pain in left hip  Stress incontinence (female) (female)     Problem List Patient Active Problem List   Diagnosis Date Noted  . Breast thickening 07/11/2015  . Mastalgia 12/27/2014  . History of right breast cancer 07/09/2014  . Asthma in adult 12/23/2012  . Ductal carcinoma in situ (DCIS) of right breast 12/23/2012    Tomasita Morrow, SPT  09/13/2019, 9:31 AM  [place] MAIN [facility] SERVICES [place], [place], [address] Phone: [phone]   Fax:  [phone]  Name: Terri Morrison MRN: [id number] Date of Birth: Jun 30, 1956

## 2019-09-19 ENCOUNTER — Ambulatory Visit: Payer: Medicare HMO

## 2019-09-26 ENCOUNTER — Ambulatory Visit: Payer: Medicare HMO

## 2019-11-05 ENCOUNTER — Telehealth: Payer: Self-pay | Admitting: Surgery

## 2019-11-05 NOTE — Telephone Encounter (Signed)
Left a message for the patient to call the office, patient needs an appointment with Dr. Dahlia Byes for her follow up for her mammogram results.

## 2019-11-27 ENCOUNTER — Encounter: Payer: Self-pay | Admitting: *Deleted

## 2019-12-03 ENCOUNTER — Encounter: Payer: Self-pay | Admitting: Surgery

## 2020-10-07 ENCOUNTER — Other Ambulatory Visit: Payer: Self-pay | Admitting: Oncology

## 2020-10-07 ENCOUNTER — Ambulatory Visit (HOSPITAL_COMMUNITY)
Admission: RE | Admit: 2020-10-07 | Discharge: 2020-10-07 | Disposition: A | Discharge status: Home / Self Care | Payer: Medicare HMO | Source: Ambulatory Visit | Attending: Pulmonary Disease | Admitting: Pulmonary Disease

## 2020-10-07 DIAGNOSIS — Z23 Encounter for immunization: Secondary | ICD-10-CM | POA: Insufficient documentation

## 2020-10-07 DIAGNOSIS — U071 COVID-19: Secondary | ICD-10-CM

## 2020-10-07 MED ORDER — SODIUM CHLORIDE 0.9 % IV SOLN: INTRAVENOUS | Status: DC | PRN

## 2020-10-07 MED ORDER — DIPHENHYDRAMINE HCL 50 MG/ML IJ SOLN: 50.0000 mg | Freq: Once | INTRAMUSCULAR | Status: DC | PRN

## 2020-10-07 MED ORDER — FAMOTIDINE IN NACL 20-0.9 MG/50ML-% IV SOLN: 20.0000 mg | Freq: Once | INTRAVENOUS | Status: DC | PRN

## 2020-10-07 MED ORDER — ALBUTEROL SULFATE HFA 108 (90 BASE) MCG/ACT IN AERS: 2.0000 | INHALATION_SPRAY | Freq: Once | RESPIRATORY_TRACT | Status: DC | PRN

## 2020-10-07 MED ORDER — EPINEPHRINE 0.3 MG/0.3ML IJ SOAJ: 0.3000 mg | Freq: Once | INTRAMUSCULAR | Status: DC | PRN

## 2020-10-07 MED ORDER — METHYLPREDNISOLONE SODIUM SUCC 125 MG IJ SOLR: 125.0000 mg | Freq: Once | INTRAMUSCULAR | Status: DC | PRN

## 2020-10-07 MED ORDER — SOTROVIMAB 500 MG/8ML IV SOLN
500.0000 mg | Freq: Once | INTRAVENOUS | Status: AC
  Administered 2020-10-07: 500 mg via INTRAVENOUS

## 2020-10-07 NOTE — Progress Notes (Signed)
Patient reviewed Fact Sheet for Patients, Parents, and Caregivers for Emergency Use Authorization (EUA) of Sotrovimab for the Treatment of Coronavirus. Patient also reviewed and is agreeable to the estimated cost of treatment. Patient is agreeable to proceed.   

## 2020-10-07 NOTE — Progress Notes (Signed)
I connected by phone with  Terri Morrison  to discuss the potential use of an new treatment for mild to moderate COVID-19 viral infection in non-hospitalized patients.   This patient is a age/sex that meets the FDA criteria for Emergency Use Authorization of casirivimab\imdevimab.  Has a (+) direct SARS-CoV-2 viral test result 1. Has mild or moderate COVID-19  2. Is ? 66 years of age and weighs ? 40 kg 3. Is NOT hospitalized due to COVID-19 4. Is NOT requiring oxygen therapy or requiring an increase in baseline oxygen flow rate due to COVID-19 5. Is within 10 days of symptom onset 6. Has at least one of the high risk factor(s) for progression to severe COVID-19 and/or hospitalization as defined in EUA. Specific high risk criteria : Past Medical History:  Diagnosis Date  . Acute mastitis of right breast 2013  . Asthma 2010  . Blood clot in vein 1978  . Hypertension 2010  . Malignant neoplasm of upper-outer quadrant of female breast [facility]) June 08, 2012   Right breast DCIS;1.5 cm; positive anterior, deep and inferior margins.Whole breast radiation. ER 5 percent, PR negative. The benefits of antiestrogen therapy were felt to be too low to warrant administration based on low ER value.  . Skin cancer 2016   chest, forehead, squamous cell  ?  ?    Symptom onset  10/05/20   I have spoken and communicated the following to the patient or parent/caregiver:   1. FDA has authorized the emergency use of casirivimab\imdevimab for the treatment of mild to moderate COVID-19 in adults and pediatric patients with positive results of direct SARS-CoV-2 viral testing who are 34 years of age and older weighing at least 40 kg, and who are at high risk for progressing to severe COVID-19 and/or hospitalization.   2. The significant known and potential risks and benefits of casirivimab\imdevimab, and the extent to which such potential risks and benefits are unknown.   3. Information on available alternative  treatments and the risks and benefits of those alternatives, including clinical trials.   4. Patients treated with casirivimab\imdevimab should continue to self-isolate and use infection control measures (e.g., wear mask, isolate, social distance, avoid sharing personal items, clean and disinfect "high touch" surfaces, and frequent handwashing) according to CDC guidelines.    5. The patient or parent/caregiver has the option to accept or refuse casirivimab\imdevimab .   After reviewing this information with the patient, The patient agreed to proceed with receiving casirivimab\imdevimab infusion and will be provided a copy of the Fact sheet prior to receiving the infusion.Rulon Abide, AGNP-C [phone] ([place])

## 2020-10-07 NOTE — Progress Notes (Signed)
Diagnosis: COVID-19  Physician: Dr. Patrick Wright  Procedure: [facility] Infusion [facility] Med: Sotrovimab infusion - Provided patient with sotrovimab fact sheet for patients, parents, and caregivers prior to infusion.   Complications: No immediate complications noted  Discharge: Discharged home    

## 2020-10-07 NOTE — Discharge Instructions (Signed)

## 2022-12-20 ENCOUNTER — Ambulatory Visit: Payer: Medicare HMO | Admitting: Dermatology

## 2023-06-10 ENCOUNTER — Ambulatory Visit: Admit: 2023-06-10 | Payer: Medicare HMO | Admitting: Gastroenterology

## 2023-06-10 SURGERY — COLONOSCOPY WITH PROPOFOL
Anesthesia: General

## 2023-09-19 ENCOUNTER — Other Ambulatory Visit: Payer: Self-pay | Admitting: Family Medicine

## 2023-09-19 DIAGNOSIS — H539 Unspecified visual disturbance: Secondary | ICD-10-CM
